# Patient Record
Sex: Female | Born: 1980 | Race: Black or African American | Hispanic: No | Marital: Single | Smoking: Never smoker
Health system: Southern US, Community
[De-identification: ages and names within clinical notes are randomized; demographics above are authoritative.]

## PROBLEM LIST (undated history)

## (undated) DIAGNOSIS — A630 Anogenital (venereal) warts: Secondary | ICD-10-CM

## (undated) DIAGNOSIS — F419 Anxiety disorder, unspecified: Secondary | ICD-10-CM

## (undated) DIAGNOSIS — R002 Palpitations: Secondary | ICD-10-CM

## (undated) DIAGNOSIS — B9689 Other specified bacterial agents as the cause of diseases classified elsewhere: Secondary | ICD-10-CM

## (undated) DIAGNOSIS — R0602 Shortness of breath: Secondary | ICD-10-CM

## (undated) DIAGNOSIS — M2550 Pain in unspecified joint: Secondary | ICD-10-CM

## (undated) DIAGNOSIS — M419 Scoliosis, unspecified: Secondary | ICD-10-CM

## (undated) DIAGNOSIS — F329 Major depressive disorder, single episode, unspecified: Secondary | ICD-10-CM

## (undated) DIAGNOSIS — M549 Dorsalgia, unspecified: Secondary | ICD-10-CM

## (undated) DIAGNOSIS — F32A Depression, unspecified: Secondary | ICD-10-CM

## (undated) DIAGNOSIS — N760 Acute vaginitis: Secondary | ICD-10-CM

## (undated) HISTORY — DX: Dorsalgia, unspecified: M54.9

## (undated) HISTORY — DX: Palpitations: R00.2

## (undated) HISTORY — DX: Pain in unspecified joint: M25.50

## (undated) HISTORY — DX: Acute vaginitis: N76.0

## (undated) HISTORY — DX: Shortness of breath: R06.02

## (undated) HISTORY — DX: Anxiety disorder, unspecified: F41.9

## (undated) HISTORY — DX: Other specified bacterial agents as the cause of diseases classified elsewhere: B96.89

## (undated) HISTORY — PX: SPINAL FUSION: SHX223

## (undated) HISTORY — DX: Anogenital (venereal) warts: A63.0

## (undated) HISTORY — DX: Scoliosis, unspecified: M41.9

---

## 2006-02-21 ENCOUNTER — Emergency Department: Payer: Self-pay

## 2006-03-05 ENCOUNTER — Emergency Department: Payer: Self-pay | Admitting: Emergency Medicine

## 2006-04-08 ENCOUNTER — Emergency Department: Payer: Self-pay | Admitting: Emergency Medicine

## 2006-04-08 ENCOUNTER — Emergency Department: Payer: Self-pay

## 2006-04-10 ENCOUNTER — Ambulatory Visit: Payer: Self-pay | Admitting: Emergency Medicine

## 2006-04-13 ENCOUNTER — Emergency Department: Payer: Self-pay | Admitting: Emergency Medicine

## 2006-04-22 ENCOUNTER — Emergency Department: Payer: Self-pay | Admitting: General Practice

## 2006-04-22 ENCOUNTER — Emergency Department: Payer: Self-pay | Admitting: Emergency Medicine

## 2006-06-04 ENCOUNTER — Observation Stay: Payer: Self-pay

## 2006-07-10 ENCOUNTER — Observation Stay: Payer: Self-pay

## 2006-07-11 DIAGNOSIS — A630 Anogenital (venereal) warts: Secondary | ICD-10-CM

## 2006-07-11 HISTORY — DX: Anogenital (venereal) warts: A63.0

## 2006-08-02 ENCOUNTER — Observation Stay: Payer: Self-pay | Admitting: Obstetrics & Gynecology

## 2006-08-24 ENCOUNTER — Observation Stay: Payer: Self-pay

## 2006-08-25 ENCOUNTER — Emergency Department: Payer: Self-pay | Admitting: Emergency Medicine

## 2006-10-05 ENCOUNTER — Observation Stay: Payer: Self-pay | Admitting: Unknown Physician Specialty

## 2006-10-15 ENCOUNTER — Inpatient Hospital Stay: Payer: Self-pay | Admitting: Obstetrics & Gynecology

## 2007-03-02 ENCOUNTER — Other Ambulatory Visit: Payer: Self-pay

## 2007-03-02 ENCOUNTER — Emergency Department: Payer: Self-pay | Admitting: Emergency Medicine

## 2007-06-09 ENCOUNTER — Emergency Department: Payer: Self-pay | Admitting: Emergency Medicine

## 2007-06-10 ENCOUNTER — Emergency Department: Payer: Self-pay | Admitting: Internal Medicine

## 2007-08-07 ENCOUNTER — Other Ambulatory Visit: Payer: Self-pay

## 2007-08-07 ENCOUNTER — Emergency Department: Payer: Self-pay | Admitting: Emergency Medicine

## 2007-08-16 ENCOUNTER — Emergency Department: Payer: Self-pay | Admitting: Emergency Medicine

## 2007-09-24 ENCOUNTER — Emergency Department: Payer: Self-pay | Admitting: Emergency Medicine

## 2007-12-01 ENCOUNTER — Emergency Department (HOSPITAL_COMMUNITY): Admission: EM | Admit: 2007-12-01 | Discharge: 2007-12-01 | Payer: Self-pay | Admitting: Emergency Medicine

## 2007-12-19 ENCOUNTER — Emergency Department (HOSPITAL_COMMUNITY): Admission: EM | Admit: 2007-12-19 | Discharge: 2007-12-19 | Payer: Self-pay | Admitting: Emergency Medicine

## 2007-12-21 ENCOUNTER — Inpatient Hospital Stay (HOSPITAL_COMMUNITY): Admission: AD | Admit: 2007-12-21 | Discharge: 2007-12-21 | Payer: Self-pay | Admitting: Obstetrics & Gynecology

## 2007-12-24 ENCOUNTER — Inpatient Hospital Stay (HOSPITAL_COMMUNITY): Admission: AD | Admit: 2007-12-24 | Discharge: 2007-12-24 | Payer: Self-pay | Admitting: Obstetrics & Gynecology

## 2008-02-12 ENCOUNTER — Other Ambulatory Visit: Payer: Self-pay

## 2008-02-12 ENCOUNTER — Emergency Department: Payer: Self-pay | Admitting: Emergency Medicine

## 2008-02-20 ENCOUNTER — Emergency Department: Payer: Self-pay | Admitting: Emergency Medicine

## 2008-06-09 ENCOUNTER — Emergency Department: Payer: Self-pay | Admitting: Internal Medicine

## 2008-12-24 ENCOUNTER — Emergency Department: Payer: Self-pay | Admitting: Emergency Medicine

## 2009-03-11 ENCOUNTER — Emergency Department: Payer: Self-pay | Admitting: Emergency Medicine

## 2009-09-08 ENCOUNTER — Emergency Department: Payer: Self-pay | Admitting: Emergency Medicine

## 2011-04-06 LAB — URINALYSIS, ROUTINE W REFLEX MICROSCOPIC
Nitrite: NEGATIVE
Specific Gravity, Urine: 1.026
pH: 6

## 2011-04-06 LAB — POCT I-STAT, CHEM 8
HCT: 38
Hemoglobin: 12.9
Potassium: 4
Sodium: 141

## 2011-04-07 LAB — CBC
Hemoglobin: 12.4
RBC: 4.24
RDW: 15

## 2011-04-07 LAB — DIFFERENTIAL
Basophils Absolute: 0
Lymphocytes Relative: 24
Monocytes Absolute: 0.2
Neutro Abs: 4.2

## 2011-04-07 LAB — URINE MICROSCOPIC-ADD ON

## 2011-04-07 LAB — URINALYSIS, ROUTINE W REFLEX MICROSCOPIC
Bilirubin Urine: NEGATIVE
Leukocytes, UA: NEGATIVE
Nitrite: NEGATIVE
Specific Gravity, Urine: 1.023
pH: 6.5

## 2011-04-07 LAB — WET PREP, GENITAL: Yeast Wet Prep HPF POC: NONE SEEN

## 2011-04-07 LAB — HCG, QUANTITATIVE, PREGNANCY
hCG, Beta Chain, Quant, S: 271 — ABNORMAL HIGH
hCG, Beta Chain, Quant, S: 66 — ABNORMAL HIGH

## 2011-04-07 LAB — ABO/RH: ABO/RH(D): B POS

## 2011-04-07 LAB — GC/CHLAMYDIA PROBE AMP, GENITAL
Chlamydia, DNA Probe: NEGATIVE
GC Probe Amp, Genital: NEGATIVE

## 2014-07-03 ENCOUNTER — Emergency Department: Payer: Self-pay | Admitting: Emergency Medicine

## 2014-07-03 LAB — URINALYSIS, COMPLETE
BILIRUBIN, UR: NEGATIVE
BLOOD: NEGATIVE
Bacteria: NONE SEEN
Glucose,UR: NEGATIVE mg/dL (ref 0–75)
Leukocyte Esterase: NEGATIVE
NITRITE: NEGATIVE
Ph: 5 (ref 4.5–8.0)
Protein: 30
Specific Gravity: 1.029 (ref 1.003–1.030)

## 2015-04-17 ENCOUNTER — Inpatient Hospital Stay
Admission: EM | Admit: 2015-04-17 | Discharge: 2015-04-22 | DRG: 885 | Disposition: A | Discharge status: Home / Self Care | Payer: Medicaid Other | Source: Intra-hospital | Attending: Psychiatry | Admitting: Psychiatry

## 2015-04-17 ENCOUNTER — Emergency Department
Admission: EM | Admit: 2015-04-17 | Discharge: 2015-04-17 | Disposition: A | Discharge status: Other Acute Inpatient Hospital | Payer: Medicaid Other | Attending: Emergency Medicine | Admitting: Emergency Medicine

## 2015-04-17 ENCOUNTER — Encounter: Payer: Self-pay | Admitting: Emergency Medicine

## 2015-04-17 DIAGNOSIS — F4010 Social phobia, unspecified: Secondary | ICD-10-CM | POA: Diagnosis present

## 2015-04-17 DIAGNOSIS — Z3A13 13 weeks gestation of pregnancy: Secondary | ICD-10-CM | POA: Insufficient documentation

## 2015-04-17 DIAGNOSIS — F332 Major depressive disorder, recurrent severe without psychotic features: Secondary | ICD-10-CM | POA: Diagnosis present

## 2015-04-17 DIAGNOSIS — F329 Major depressive disorder, single episode, unspecified: Secondary | ICD-10-CM | POA: Diagnosis not present

## 2015-04-17 DIAGNOSIS — F4310 Post-traumatic stress disorder, unspecified: Secondary | ICD-10-CM | POA: Diagnosis present

## 2015-04-17 DIAGNOSIS — Z5900 Homelessness unspecified: Secondary | ICD-10-CM

## 2015-04-17 DIAGNOSIS — Z3490 Encounter for supervision of normal pregnancy, unspecified, unspecified trimester: Secondary | ICD-10-CM

## 2015-04-17 DIAGNOSIS — R45851 Suicidal ideations: Secondary | ICD-10-CM | POA: Diagnosis present

## 2015-04-17 DIAGNOSIS — O4690 Antepartum hemorrhage, unspecified, unspecified trimester: Secondary | ICD-10-CM

## 2015-04-17 DIAGNOSIS — F32A Depression, unspecified: Secondary | ICD-10-CM

## 2015-04-17 DIAGNOSIS — Z62811 Personal history of psychological abuse in childhood: Secondary | ICD-10-CM | POA: Diagnosis present

## 2015-04-17 DIAGNOSIS — F339 Major depressive disorder, recurrent, unspecified: Secondary | ICD-10-CM | POA: Diagnosis present

## 2015-04-17 DIAGNOSIS — G4710 Hypersomnia, unspecified: Secondary | ICD-10-CM | POA: Diagnosis present

## 2015-04-17 DIAGNOSIS — O99341 Other mental disorders complicating pregnancy, first trimester: Secondary | ICD-10-CM | POA: Diagnosis present

## 2015-04-17 DIAGNOSIS — O209 Hemorrhage in early pregnancy, unspecified: Secondary | ICD-10-CM

## 2015-04-17 DIAGNOSIS — Z818 Family history of other mental and behavioral disorders: Secondary | ICD-10-CM

## 2015-04-17 HISTORY — DX: Palpitations: R00.2

## 2015-04-17 HISTORY — DX: Depression, unspecified: F32.A

## 2015-04-17 HISTORY — DX: Major depressive disorder, single episode, unspecified: F32.9

## 2015-04-17 LAB — URINE DRUG SCREEN, QUALITATIVE (ARMC ONLY)
Amphetamines, Ur Screen: NOT DETECTED
BARBITURATES, UR SCREEN: NOT DETECTED
Benzodiazepine, Ur Scrn: NOT DETECTED
CANNABINOID 50 NG, UR ~~LOC~~: NOT DETECTED
COCAINE METABOLITE, UR ~~LOC~~: NOT DETECTED
MDMA (ECSTASY) UR SCREEN: NOT DETECTED
Methadone Scn, Ur: NOT DETECTED
Opiate, Ur Screen: NOT DETECTED
PHENCYCLIDINE (PCP) UR S: NOT DETECTED
TRICYCLIC, UR SCREEN: NOT DETECTED

## 2015-04-17 MED ORDER — ACETAMINOPHEN 325 MG PO TABS: 650.0000 mg | ORAL_TABLET | Freq: Once | ORAL | Status: DC

## 2015-04-17 NOTE — ED Notes (Signed)
BEHAVIORAL HEALTH ROUNDING Patient sleeping: No. Patient alert and oriented: yes Behavior appropriate: Yes.  ;  Nutrition and fluids offered: Yes  Toileting and hygiene offered: Yes  Sitter present: no Law enforcement present: Yes   

## 2015-04-17 NOTE — ED Notes (Addendum)
BEHAVIORAL HEALTH ROUNDING Patient sleeping: No. Patient alert and oriented: yes Behavior appropriate: Yes.  ; If no, describe:  Nutrition and fluids offered: Yes  Toileting and hygiene offered: Yes  Sitter present: yes Law enforcement present: Yes  

## 2015-04-17 NOTE — ED Notes (Signed)
TTS at bedside. 

## 2015-04-17 NOTE — ED Provider Notes (Signed)
-----------------------------------------   4:34 PM on 04/17/2015 -----------------------------------------  Patient has been seen by psychiatry, they are voluntarily admitting the patient to their service for further treatment and evaluation of the patient's depression.  Minna Antis, MD 04/17/15 [phone]
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
## 2015-04-17 NOTE — ED Provider Notes (Signed)
[facility] Emergency Department Provider Note REMINDER - THIS NOTE IS NOT A FINAL MEDICAL RECORD UNTIL IT IS SIGNED. UNTIL THEN, THE CONTENT BELOW MAY REFLECT INFORMATION FROM A DOCUMENTATION TEMPLATE, NOT THE ACTUAL PATIENT VISIT. ____________________________________________  Time seen: Approximately 11:32 AM  I have reviewed the triage vital signs and the nursing notes.   HISTORY  Chief Complaint Depression    HPI Cathy Dunn is a 34 y.o. female reports a long-standing history of depression. She reports that due to stresses of taking care of children at home, and also being [redacted] weeks pregnant that she is becoming more depressed again. She is currently not on any treatment. She adamantly denies any thoughts of hurting herself or anyone else. She has no hallucinations. She comes voluntarily seeking care for worsening depression. She does report that time she feels just no sense of wanting to live, that she is just overwhelmed with life and stressors. Denies any previous attempts to injure herself. She reports she's had depression since approximately age 58.   No pain. No recent infections or fevers. She is planning to set up care with [facility] within the next 1-2 weeks regarding this pregnancy. She does not smoke.  Past Medical History  Diagnosis Date  . Depression   . Heart palpitations     There are no active problems to display for this patient.   History reviewed. No pertinent past surgical history.  No current outpatient prescriptions on file.  Allergies Review of patient's allergies indicates no known allergies.  History reviewed. No pertinent family history.  Social History Social History  Substance Use Topics  . Smoking status: Never Smoker   . Smokeless tobacco: None  . Alcohol Use: No    Review of Systems Constitutional: No fever/chills Eyes: No visual changes. ENT: No sore throat. Cardiovascular:  Denies chest pain. Respiratory: Denies shortness of breath. Gastrointestinal: No abdominal pain.  No nausea, no vomiting.  No diarrhea.  No constipation. Genitourinary: Negative for dysuria. Musculoskeletal: Negative for back pain. Skin: Negative for rash. Positive for pregnancy. Denies any abdominal pain, vaginal discharge or other symptoms. Neurological: Negative for headaches, focal weakness or numbness.  10-point ROS otherwise negative.  ____________________________________________   PHYSICAL EXAM:  VITAL SIGNS: ED Triage Vitals  Enc Vitals Group     BP 04/17/15 1041 132/75 mmHg     Pulse Rate 04/17/15 1041 80     Resp 04/17/15 1041 18     Temp 04/17/15 1041 98.3 F (36.8 C)     Temp Source 04/17/15 1041 Oral     SpO2 04/17/15 1041 100 %     Weight 04/17/15 1041 250 lb (113.399 kg)     Height 04/17/15 1041  (1.651 m)     Head Cir --      Peak Flow --      Pain Score 04/17/15 1044 0     Pain Loc --      Pain Edu? --      Excl. in GC? --    Constitutional: Alert and oriented. Well appearing and in no acute distress. Patient is very calm, amicable. Eyes: Conjunctivae are normal. PERRL. EOMI. Head: Atraumatic. Nose: No congestion/rhinnorhea. Mouth/Throat: Mucous membranes are moist.  Oropharynx non-erythematous. Neck: No stridor.   Cardiovascular: Normal rate, regular rhythm. Grossly normal heart sounds.  Good peripheral circulation. Respiratory: Normal respiratory effort.  No retractions. Lungs CTAB. Gastrointestinal: Soft and nontender. No distention. No abdominal bruits. No CVA tenderness. Musculoskeletal: No  lower extremity tenderness nor edema.  No joint effusions. Neurologic:  Normal speech and language. No gross focal neurologic deficits are appreciated. No gait instability. Skin:  Skin is warm, dry and intact. No rash noted. Psychiatric: Mood and affect are normal. Speech and behavior are normal. The patient is calm and  appropriate.  ____________________________________________   LABS (all labs ordered are listed, but only abnormal results are displayed)  Labs Reviewed  POC URINE PREG, ED   ____________________________________________  EKG   ____________________________________________  RADIOLOGY   ____________________________________________   PROCEDURES  Procedure(s) performed: None  Critical Care performed: No  ____________________________________________   INITIAL IMPRESSION / ASSESSMENT AND PLAN / ED COURSE  Pertinent labs & imaging results that were available during my care of the patient were reviewed by me and considered in my medical decision making (see chart for details).  Patient presents for evaluation of depression. She does not have any concerning symptoms such as suicidal thoughts, hallucinations. She expresses insight into her disease, and is here for evaluation for ongoing psychiatric care. Have ordered consultation by psychiatry, for which the patient is presently voluntary. She is pregnant, she is aware of this and reports she will following up with  health Department in the next 1-2 weeks.  ----------------------------------------- 3:56 PM on 04/17/2015 -----------------------------------------  Ongoing care assigned Dr. Lenard Lance. Follow-up on consult recommendations Dr. Toni Amend. ____________________________________________   FINAL CLINICAL IMPRESSION(S) / ED DIAGNOSES  Final diagnoses:  Depression      Sharyn Creamer, MD 04/17/15 1557

## 2015-04-17 NOTE — ED Notes (Signed)

## 2015-04-17 NOTE — ED Notes (Signed)
RN called TTS to request update on pts room status in behavioral unit. TTS reports they will call and request update and then will call this RN with update.
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
## 2015-04-17 NOTE — ED Notes (Addendum)

## 2015-04-17 NOTE — ED Notes (Signed)
BEHAVIORAL HEALTH ROUNDING Patient sleeping: No. Patient alert and oriented: yes Behavior appropriate: Yes.  ; If no, describe:  Nutrition and fluids offered: Yes  Toileting and hygiene offered: Yes  Sitter present: yes Law enforcement present: Yes  

## 2015-04-17 NOTE — ED Notes (Signed)
Pt to ed with c/o depression intermittently x 2 years,  Pt denies si, denies hi, denies hallucinations.

## 2015-04-18 ENCOUNTER — Inpatient Hospital Stay: Payer: Medicaid Other

## 2015-04-18 ENCOUNTER — Encounter: Payer: Self-pay | Admitting: Psychiatry

## 2015-04-18 DIAGNOSIS — F332 Major depressive disorder, recurrent severe without psychotic features: Principal | ICD-10-CM

## 2015-04-18 LAB — TYPE AND SCREEN
ABO/RH(D): B POS
ANTIBODY SCREEN: NEGATIVE

## 2015-04-18 LAB — HCG, QUANTITATIVE, PREGNANCY: HCG, BETA CHAIN, QUANT, S: 78801 m[IU]/mL — AB (ref <5)

## 2015-04-18 LAB — ABO/RH: ABO/RH(D): B POS

## 2015-04-18 MED ORDER — FOLIC ACID 1 MG PO TABS
1.0000 mg | ORAL_TABLET | Freq: Every day | ORAL | Status: DC
  Administered 2015-04-18 – 2015-04-22 (×5): 1 mg via ORAL
  Filled 2015-04-18 (×7): qty 1

## 2015-04-18 MED ORDER — ACETAMINOPHEN 325 MG PO TABS: 650.0000 mg | ORAL_TABLET | Freq: Four times a day (QID) | ORAL | Status: DC | PRN

## 2015-04-18 MED ORDER — DIPHENHYDRAMINE HCL 25 MG PO CAPS
50.0000 mg | ORAL_CAPSULE | Freq: Every evening | ORAL | Status: DC | PRN
  Administered 2015-04-18 – 2015-04-21 (×4): 50 mg via ORAL
  Filled 2015-04-18 (×4): qty 2

## 2015-04-18 MED ORDER — FLUOXETINE HCL 10 MG PO CAPS
10.0000 mg | ORAL_CAPSULE | Freq: Every day | ORAL | Status: DC
  Administered 2015-04-18 – 2015-04-19 (×2): 10 mg via ORAL
  Filled 2015-04-18 (×2): qty 1

## 2015-04-18 MED ORDER — PRENATAL MULTIVITAMIN CH
1.0000 | ORAL_TABLET | Freq: Every day | ORAL | Status: DC
  Administered 2015-04-19 – 2015-04-22 (×4): 1 via ORAL
  Filled 2015-04-18 (×7): qty 1

## 2015-04-18 MED ORDER — MAGNESIUM HYDROXIDE 400 MG/5ML PO SUSP
30.0000 mL | Freq: Every day | ORAL | Status: DC | PRN
Start: 2015-04-18 — End: 2015-04-22

## 2015-04-18 MED ORDER — ALUM & MAG HYDROXIDE-SIMETH 200-200-20 MG/5ML PO SUSP
30.0000 mL | ORAL | Status: DC | PRN
Start: 2015-04-18 — End: 2015-04-22

## 2015-04-18 NOTE — Progress Notes (Signed)
D: Patient has been isolative to her room most of the day. She was tearful this morning because she said her child was in the hospital here, and she couldn't see her. Patient was encouraged to speak with physician about this but she has not mentioned any more about it to nursing staff. Denies SI/HI/AVH. She c/o light bleeding this evening with mild abdominal cramping that she rated 3/10. A: Dr. Toni Amend notified of vaginal bleeding and Ob-Gyn consult called to Dr. Bonney Aid. Orders obtained. R: Continue to monitor.

## 2015-04-18 NOTE — BHH Suicide Risk Assessment (Signed)
[facility] Admission Suicide Risk Assessment   Nursing information obtained from:    review of current nursing notes. Discussion with nursing staff. Demographic factors:    patient has no clear place to live. Major financial stress. Family history of depression. Current Mental Status:    continues to be depressed. No suicidal plan. No active psychosis. Loss Factors:    loss of custody of child. Historical Factors:    history of recurrent depression Risk Reduction Factors:    very concerned about her unborn child and caring for her living child Total Time spent with patient: 35 minutes Principal Problem: Major depressive disorder (HCC) Diagnosis:   Patient Active Problem List   Diagnosis Date Noted  . Major depressive disorder (HCC) [F32.9] 04/18/2015  . Major depression (HCC) [F32.9] 04/17/2015  . Pregnant [Z33.1] 04/17/2015  . PTSD (post-traumatic stress disorder) [F43.10] 04/17/2015  . Homeless [Z59.0] 04/17/2015     Continued Clinical Symptoms:  Alcohol Use Disorder Identification Test Final Score (AUDIT): 1 The "Alcohol Use Disorders Identification Test", Guidelines for Use in Primary Care, Second Edition.  World Science writer [facility]). Score between 0-7:  no or low risk or alcohol related problems. Score between 8-15:  moderate risk of alcohol related problems. Score between 16-19:  high risk of alcohol related problems. Score 20 or above:  warrants further diagnostic evaluation for alcohol dependence and treatment.   CLINICAL FACTORS:   Depression:   Anhedonia Hopelessness   Musculoskeletal: Strength & Muscle Tone: within normal limits Gait & Station: normal Patient leans: N/A  Psychiatric Specialty Exam: Physical Exam  Nursing note and vitals reviewed. Constitutional: She appears well-developed and well-nourished.  HENT:  Head: Normocephalic and atraumatic.  Eyes: Conjunctivae are normal. Pupils are equal, round, and reactive to light.  Neck: Normal range of motion.   Cardiovascular: Normal heart sounds.   Respiratory: Effort normal.  GI: Soft. She exhibits distension.  Musculoskeletal: Normal range of motion.  Neurological: She is alert.  Skin: Skin is warm and dry.  Psychiatric: Judgment and thought content normal. Her affect is blunt. Her speech is delayed. She is slowed. Cognition and memory are normal. She exhibits a depressed mood.    Review of Systems  Constitutional: Negative.   HENT: Negative.   Eyes: Negative.   Respiratory: Negative.   Cardiovascular: Negative.   Gastrointestinal: Negative.   Musculoskeletal: Negative.   Skin: Negative.   Neurological: Negative.   Psychiatric/Behavioral: Positive for depression and suicidal ideas. Negative for hallucinations and substance abuse. The patient is nervous/anxious. The patient does not have insomnia.     Blood pressure 108/84, pulse 93, temperature 98.6 F (37 C), temperature source Oral, resp. rate 20, height  (1.651 m), weight 113.399 kg (250 lb), last menstrual period 01/18/2015, SpO2 99 %.Body mass index is 41.6 kg/(m^2).  General Appearance: Casual  Eye Contact::  Fair  Speech:  Clear and Coherent  Volume:  Decreased  Mood:  Dysphoric  Affect:  Congruent  Thought Process:  Goal Directed  Orientation:  Full (Time, Place, and Person)  Thought Content:  Negative  Suicidal Thoughts:  Yes.  without intent/plan  Homicidal Thoughts:  No  Memory:  Immediate;   Fair Recent;   Fair Remote;   Fair  Judgement:  Fair  Insight:  Fair  Psychomotor Activity:  Decreased  Concentration:  Fair  Recall:  [employer] of Knowledge:Fair  Language: Good  Akathisia:  No  Handed:  Right  AIMS (if indicated):     Assets:  Communication Skills  Desire for Improvement Physical Health Resilience  Sleep:  Number of Hours: 4.45  Cognition: WNL  ADL's:  Intact     COGNITIVE FEATURES THAT CONTRIBUTE TO RISK:  None    SUICIDE RISK:   Mild:  Suicidal ideation of limited frequency,  intensity, duration, and specificity.  There are no identifiable plans, no associated intent, mild dysphoria and related symptoms, good self-control (both objective and subjective assessment), few other risk factors, and identifiable protective factors, including available and accessible social support.  PLAN OF CARE: Patient continues to have some suicidal thoughts but without intent or plan and has a very positive feeling about wanting to take care of her child. Patient is being started on antidepressives. She is on 15 minute checks. Participating in group therapy. Outpatient follow-up being arranged.  Medical Decision Making:  New problem, with additional work up planned, Review of Psycho-Social Stressors (1), Review or order clinical lab tests (1), Review or order medicine tests (1) and Review of Medication Regimen & Side Effects (2)  I certify that inpatient services furnished can reasonably be expected to improve the patient's condition.   Jeilani Grupe 04/18/2015, 3:08 PM
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
## 2015-04-18 NOTE — Consult Note (Signed)
Obstetrics & Gynecology Consult H&P    Consulting Department: Behavioral Health  Consulting Physician: Mordecai Rasmussen MD  Consulting Question: Pregnant and spotting    History of Present Illness: Patient is a 34 y.o. [id number] at [redacted]w[redacted]d by sure LMP of 01/18/2015 giving EDC of 10/25/2015 currently admitted voluntarily to behavioral health for depression. This is an unplanned pregnancy, but desired pregnancy.  The patient has experienced nausea, no emesis, fatigue, and a recent episode of spotting when whipping after going to the bathroom.   Some mild cramping.  No passage of tissue.  Has not had any care this pregnancy, no prior ultrasound.  Her past pregnancies have been uncomplicated.  3 vaginal deliveries, largest 8+ pounds.  TB denies exposure Varicella history of Close contact with children (53, 41, and 80 years old) Remote history of chlamydia No cats No history of genetic diseases other than one daughter is sickle cell trait positive No history of birth defects    Review of Systems:10 point review of systems  Past Medical History:  Past Medical History  Diagnosis Date  . Depression   . Heart palpitations     Past Surgical History:  History reviewed. No pertinent past surgical history.   Family History:  History reviewed. No pertinent family history.  Social History:  Social History   Social History  . Marital Status: Single    Spouse Name: N/A  . Number of Children: N/A  . Years of Education: N/A   Occupational History  . Not on file.   Social History Main Topics  . Smoking status: Never Smoker   . Smokeless tobacco: Not on file  . Alcohol Use: No  . Drug Use: No  . Sexual Activity: Not on file   Other Topics Concern  . Not on file   Social History Narrative    Allergies:  No Known Allergies  Medications: Prior to Admission medications   Not on File    Physical Exam Vitals: Blood pressure 108/84, pulse 93, temperature 98.6 F (37 C), temperature  source Oral, resp. rate 20, height  (1.651 m), weight 113.399 kg (250 lb), last menstrual period 01/18/2015, SpO2 99 %.  General: NAD HEENT: normocephalic, anicteric Pulmonary: no increased work of breathing Cardiovascular: RRR Abdomen: NABS, soft, non-tender Genitourinary: deferred patient going down to ultrasound Extremities: no edema Neurologic: CN II-XII intact Psychiatric: A&O x 3, mood appropriate affect full  Labs: Results for orders placed or performed during the hospital encounter of 04/17/15 (from the past 72 hour(s))  Type and screen     Status: None   Collection Time: 04/18/15  6:07 PM  Result Value Ref Range   ABO/RH(D) B POS    Antibody Screen NEG    Sample Expiration 04/21/2015   hCG, quantitative, pregnancy     Status: Abnormal   Collection Time: 04/18/15  6:07 PM  Result Value Ref Range   hCG, Beta Chain, Quant, S 78801 (H) <5 mIU/mL    Comment:          GEST. AGE      CONC.  (mIU/mL)   <=1 WEEK        5 - 50     2 WEEKS       50 - 500     3 WEEKS       100 - 10,000     4 WEEKS     1,000 - 30,000     5 WEEKS     3,500 - 115,000  6-8 WEEKS     12,000 - 270,000    12 WEEKS     15,000 - 220,000        FEMALE AND NON-PREGNANT FEMALE:     LESS THAN 5 mIU/mL   ABO/Rh     Status: None   Collection Time: 04/18/15  6:08 PM  Result Value Ref Range   ABO/RH(D) B POS     Imaging No results found.  Assessment: 34 y.o. Z6X0960 currently admitted for depression, approximately 13 weeks pregnancy with spottting  Plan: 1) 1st trimester bleeding - discussed with patient that up to 1/3 of normal pregnancies will have some spotting.  As previable no specific interventions or precautions warranted. - TVUS to evaluate pregnancy and rule out ectopic - B pos rhogam not indicated  2) Depression - currently on Prozac (fluoxetine).  SSRI use in pregnancy as a drug class is associated with potential for neonatal abstinence syndrome in some infants.  No long term  neurodevelopmental deficits have been documented.  The preferred SSRI in pregnancy is Zoloft (sertraline).  Human studies have inconsistently shown a possible association between fluoxetine exposure and fetal cardiac defects.  If primary team feels therapeutic benefits outweigh risks would be ok to continue, I did discuss that in this setting would recommend fetal echocardiogram at 20-[redacted] weeks gestation  3) Start PNV  4) Will continue to follow during course of admission

## 2015-04-18 NOTE — Plan of Care (Signed)
Problem: Ineffective individual coping Goal: LTG: Patient will report a decrease in negative feelings Outcome: Not Progressing Depressed with flat affect. Denies SI.

## 2015-04-18 NOTE — Progress Notes (Signed)
Pt is a 34 year old female admitted for ongoing depression and suicide ideations without any specific plans. Pt is AOx3, vital sign stable, pt denies any pain. Pt is in a depressed mood and tearful during admission. 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 [place] she is currently living in a hotel. Pt is 3 months pregnant and has 3 other children under the age of 8 off all whom she has no custody of. Pt is currently denying any SI/HI/VAH, contracting to safety. Pt body audit completed, no contraband found. 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 Pt is oriented to the unit and room, handbook given, pt provided with food and drink, no concerns voiced, safety maintained.

## 2015-04-18 NOTE — Plan of Care (Signed)
Problem: Consults Goal: [facility] General Treatment Patient Education Outcome: Progressing Education completed, pt progressing towards accomplishing the goal.

## 2015-04-18 NOTE — Tx Team (Signed)
Initial Interdisciplinary Treatment Plan   PATIENT STRESSORS: Financial difficulties Loss of job Pregnant and homeless   PATIENT STRENGTHS: Ability for insight Average or above average intelligence Communication skills   PROBLEM LIST: Problem List/Patient Goals Date to be addressed Date deferred Reason deferred Estimated date of resolution  Depression      Financial difficulties      Pregnant                                           DISCHARGE CRITERIA:  Ability to meet basic life and health needs Improved stabilization in mood, thinking, and/or behavior  PRELIMINARY DISCHARGE PLAN: Outpatient therapy Placement in alternative living arrangements  PATIENT/FAMIILY INVOLVEMENT: This treatment plan has been presented to and reviewed with the patient, Cathy Dunn.  The patient have been given the opportunity to ask questions and make suggestions.  Joanathan Affeldt A Janesia Joswick 04/18/2015, 5:10 AM

## 2015-04-19 DIAGNOSIS — F332 Major depressive disorder, recurrent severe without psychotic features: Secondary | ICD-10-CM

## 2015-04-19 MED ORDER — SERTRALINE HCL 25 MG PO TABS
25.0000 mg | ORAL_TABLET | Freq: Every day | ORAL | Status: DC
  Filled 2015-04-19 (×2): qty 1
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
## 2015-04-19 NOTE — Progress Notes (Signed)
Obstetric and Gynecology  Subjective    Objective   Filed Vitals:   04/19/15 0659  BP: 125/85  Pulse: 91  Temp:   Resp:      Intake/Output Summary (Last 24 hours) at 04/19/15 1258 Last data filed at 04/19/15 1252  Gross per 24 hour  Intake    840 ml  Output      0 ml  Net    840 ml    General: Cardiovascular: Abdomen: Extremities:  Labs: Results for orders placed or performed during the hospital encounter of 04/17/15 (from the past 24 hour(s))  Type and screen     Status: None   Collection Time: 04/18/15  6:07 PM  Result Value Ref Range   ABO/RH(D) B POS    Antibody Screen NEG    Sample Expiration 04/21/2015   hCG, quantitative, pregnancy     Status: Abnormal   Collection Time: 04/18/15  6:07 PM  Result Value Ref Range   hCG, Beta Chain, Quant, S 46962 (H) <5 mIU/mL  ABO/Rh     Status: None   Collection Time: 04/18/15  6:08 PM  Result Value Ref Range   ABO/RH(D) B POS     Cultures: Results for orders placed or performed during the hospital encounter of 12/19/07  Wet prep, genital     Status: Abnormal   Collection Time: 12/19/07  7:11 AM  Result Value Ref Range Status   Yeast Wet Prep HPF POC NONE SEEN  Final   Trich, Wet Prep NONE SEEN  Final   Clue Cells Wet Prep HPF POC NONE SEEN  Final   WBC, Wet Prep HPF POC (A)  Final    FEW SPECIMEN OVERDILUTED.  RESULTS MAY BE AFFECTED.    Imaging: US Ob Comp Less 14 Wks  04/18/2015   CLINICAL DATA:  VAGINAL BLEEDING FOR 4 HR  EXAM: OBSTETRIC <14 WK ULTRASOUND  TECHNIQUE: Transabdominal ultrasound was performed for evaluation of the gestation as well as the maternal uterus and adnexal regions.  COMPARISON:  None.  FINDINGS: Intrauterine gestational sac: SINGLE  Yolk sac:  NO  Embryo:  YES  Cardiac Activity: YES  Heart Rate: 147 bpm  MSD:   mm    w     d  CRL:   65  mm   12 w 6 d                  [place] EDC: 10/25/2015  Maternal uterus/adnexae: UNREMARKABLE  IMPRESSION: Single living intrauterine gestation measuring 12  weeks 6 days by crown-rump length.   Electronically Signed   By: Ellery Plunk M.D.   On: 04/18/2015 22:52    Assessment   34 y.o. G5P0 at [redacted]w[redacted]d by D=12 week ultrasound currently admitted for depression, recent episode of 1st trimester spotting  Plan  - 1st trimester spotting reassurance provided - viable IUP - PNV written for - Once again will defer to primary team as to continuation of current SSRI given possible association with cardiac defect vs switching to zoloft. -  Will sign off, arrange follow up within the next week at [facility] to start prenatal care  '

## 2015-04-19 NOTE — Progress Notes (Signed)
[facility] MD Progress Note  04/19/2015 4:20 PM Cathy Dunn  MRN:  [id number] Subjective:  Follow-up for this 34 year old woman with major depression who is also pregnant. Asian interviewed. Labs reviewed. Vital signs reviewed. Consult note from OB/GYN reviewed. Nursing note reviewed. Social work note reviewed. Patient states that her mood is feeling better today. She has had a conversation with her family and they appear to be willing to help her find a more stable place to stay. She denies any suicidal ideation.  Last night she complained of some cramping and possible spotting. OB/GYN consult appreciated. Patient appears to be stable with no pregnancy complications. Principal Problem: Major depressive disorder (HCC) Diagnosis:   Patient Active Problem List   Diagnosis Date Noted  . Major depressive disorder (HCC) [F32.9] 04/18/2015  . Major depression (HCC) [F32.9] 04/17/2015  . Pregnant [Z33.1] 04/17/2015  . PTSD (post-traumatic stress disorder) [F43.10] 04/17/2015  . Homeless [Z59.0] 04/17/2015   Total Time spent with patient: 25 minutes  Past Psychiatric History: Patient has a long-standing history of depression by her account but no previous hospitalizations. Was receiving outpatient treatment in [place] until a few months ago. No previous suicide attempts.  Past Medical History:  Past Medical History  Diagnosis Date  . Depression   . Heart palpitations    History reviewed. No pertinent past surgical history. Family History: History reviewed. No pertinent family history. Family Psychiatric  History: Patient reports there being a family history of depression and anxiety in both of her parents. Social History:  History  Alcohol Use No     History  Drug Use No    Social History   Social History  . Marital Status: Single    Spouse Name: N/A  . Number of Children: N/A  . Years of Education: N/A   Social History Main Topics  . Smoking status: Never Smoker   . Smokeless  tobacco: None  . Alcohol Use: No  . Drug Use: No  . Sexual Activity: Not Asked   Other Topics Concern  . None   Social History Narrative   Additional Social History:                         Sleep: Fair  Appetite:  Good  Current Medications: Current Facility-Administered Medications  Medication Dose Route Frequency Provider Last Rate Last Dose  . acetaminophen (TYLENOL) tablet 650 mg  650 mg Oral Q6H PRN Audery Amel, MD      . alum & mag hydroxide-simeth (MAALOX/MYLANTA) 200-200-20 MG/5ML suspension 30 mL  30 mL Oral Q4H PRN Audery Amel, MD      . diphenhydrAMINE (BENADRYL) capsule 50 mg  50 mg Oral QHS PRN Audery Amel, MD   50 mg at 04/18/15 2251  . folic acid (FOLVITE) tablet 1 mg  1 mg Oral Daily Audery Amel, MD   1 mg at 04/19/15 1043  . magnesium hydroxide (MILK OF MAGNESIA) suspension 30 mL  30 mL Oral Daily PRN Audery Amel, MD      . prenatal multivitamin tablet 1 tablet  1 tablet Oral Q1200 Vena Austria, MD   1 tablet at 04/19/15 0100  . sertraline (ZOLOFT) tablet 25 mg  25 mg Oral Daily Audery Amel, MD        Lab Results:  Results for orders placed or performed during the hospital encounter of 04/17/15 (from the past 48 hour(s))  Type and screen     Status:  None   Collection Time: 04/18/15  6:07 PM  Result Value Ref Range   ABO/RH(D) B POS    Antibody Screen NEG    Sample Expiration 04/21/2015   hCG, quantitative, pregnancy     Status: Abnormal   Collection Time: 04/18/15  6:07 PM  Result Value Ref Range   hCG, Beta Chain, Quant, S 78801 (H) <5 mIU/mL    Comment:          GEST. AGE      CONC.  (mIU/mL)   <=1 WEEK        5 - 50     2 WEEKS       50 - 500     3 WEEKS       100 - 10,000     4 WEEKS     1,000 - 30,000     5 WEEKS     3,500 - 115,000   6-8 WEEKS     12,000 - 270,000    12 WEEKS     15,000 - 220,000        FEMALE AND NON-PREGNANT FEMALE:     LESS THAN 5 mIU/mL   ABO/Rh     Status: None   Collection Time: 04/18/15   6:08 PM  Result Value Ref Range   ABO/RH(D) B POS     Physical Findings: AIMS:  , ,  ,  ,    CIWA:    COWS:     Musculoskeletal: Strength & Muscle Tone: within normal limits Gait & Station: normal Patient leans: N/A  Psychiatric Specialty Exam: Review of Systems  Constitutional: Negative.   HENT: Negative.   Eyes: Negative.   Respiratory: Negative.   Cardiovascular: Negative.   Gastrointestinal: Negative.   Musculoskeletal: Negative.   Skin: Negative.   Neurological: Negative.   Psychiatric/Behavioral: Negative for depression, suicidal ideas, hallucinations, memory loss and substance abuse. The patient is not nervous/anxious and does not have insomnia.     Blood pressure 125/85, pulse 91, temperature 99.1 F (37.3 C), temperature source Oral, resp. rate 20, height  (1.651 m), weight 113.399 kg (250 lb), last menstrual period 01/18/2015, SpO2 99 %.Body mass index is 41.6 kg/(m^2).  General Appearance: Fairly Groomed  Patent attorney::  Fair  Speech:  Clear and Coherent  Volume:  Normal  Mood:  Euthymic  Affect:  Congruent  Thought Process:  Goal Directed  Orientation:  Full (Time, Place, and Person)  Thought Content:  Negative  Suicidal Thoughts:  No  Homicidal Thoughts:  No  Memory:  Immediate;   Fair Recent;   Fair Remote;   Fair  Judgement:  Intact  Insight:  Fair  Psychomotor Activity:  Normal  Concentration:  Fair  Recall:  [employer] of Knowledge:Fair  Language: Fair  Akathisia:  No  Handed:  Right  AIMS (if indicated):     Assets:  Communication Skills Desire for Improvement Resilience Social Support  ADL's:  Intact  Cognition: WNL  Sleep:  Number of Hours: 6.5   Treatment Plan Summary: Daily contact with patient to assess and evaluate symptoms and progress in treatment, Medication management and Plan Patient continues to participate well on the unit. She is going to daily group psychotherapy. Participated well and was open in individual therapy.  No complaints of any medicine side effects. I reviewed the note from the OB/GYN. I am aware that over the last couple decades there've been multiple studies regarding the relative safety of antidepressives in pregnancy which  at various times have pointed fingers at various different medicines. The one thing that is consistent is that peroxide 15 is considered higher risk. I am however now aware that the currently most influential study is the 81 [facility] medical journal studies suggesting that fluoxetine may also share some of the increased risk of cardiac defects, although previous studies had actually shown the reverse with other medicines being of higher risk. Nevertheless I will defer to the current perception and understanding and discontinue the fluoxetine and start the patient on sertraline 25 mg per day. Patient is requesting discharge within the next day or so and I encouraged her to talk with her primary psychiatrist.No other change to medical plan. Continue vitamins.  John Clapacs 04/19/2015, 4:20 PM

## 2015-04-19 NOTE — Progress Notes (Signed)
D: Pt is awake and active in the milieu this evening. Pt mood is depressed and his affect is sad/flat. Pt denies SI/HI and AVH at this time. MD ordered ultrasound imaging and met with pt on the unit.   A: Writer provided emotional support and administered medications as prescribed. Writer confirmed availability on imaging schedule and transported pt upstairs.   R: Pt seemed pleased that the imaging results were positive, She had no complaints and went to bed shortly after her procedure.

## 2015-04-19 NOTE — BHH Group Notes (Signed)
[facility] Group Notes:  (Nursing/MHT/Case Management/Adjunct)  Date:  04/19/2015  Time:  2:23 AM  Type of Therapy:  Group Therapy  Participation Level:  Active  Participation Quality:  Appropriate  Affect:  Appropriate  Cognitive:  Appropriate  Insight:  Appropriate  Engagement in Group:  Engaged  Modes of Intervention:  n/a  Summary of Progress/Problems:  Cathy Dunn 04/19/2015, 2:23 AM

## 2015-04-19 NOTE — Progress Notes (Signed)
D: Patient has appeared depressed with a flat affect. Denies SI. Emotionally guarded. Does not discuss her pregnancy. A: On q 15 minute checks; Given meds. R: Sad and depressed.

## 2015-04-19 NOTE — Progress Notes (Signed)
Patient declined to take Zoloft. She requests to stay on Prozac.

## 2015-04-19 NOTE — Progress Notes (Signed)
   04/19/15 1630  Clinical Encounter Type  Visited With Patient  Visit Type Initial  Referral From Nurse  Consult/Referral To Chaplain  Spiritual Encounters  Spiritual Needs Emotional  Stress Factors  Patient Stress Factors Major life changes  Rec'd consultation request. Visited with pt. Pt told about her reason for being here. Expressed that things are going well and expects to be released tomorrow. Pt thanked for the visit offering of prayer. Konrad Penta [phone]

## 2015-04-19 NOTE — B[facility] Group Notes (Signed)
B[facility] LCSW Group Therapy  04/19/2015 4:11 PM  Type of Therapy:  Group Therapy  Participation Level:  Minimal  Participation Quality:  Attentive  Affect:  Depressed  Cognitive:  Alert  Insight:  Limited  Engagement in Therapy:  Limited  Modes of Intervention:  Discussion, Education, Socialization and Support  Summary of Progress/Problems: Todays topic: Grudges  Patients will be encouraged to discuss their thoughts, feelings, and behaviors as to why one holds on to grudges and reasons why people have grudges. Patients will process the impact of grudges on their daily lives and identify thoughts and feelings related to holding grudges. Patients will identify feelings and thoughts related to what life would look like without grudges. Pt attended group and stayed the entire time. She sat quietly and listened to other group members.   Cathy Dunn L Brady Plant MSW, LCSWA  04/19/2015, 4:11 PM

## 2015-04-19 NOTE — Plan of Care (Signed)
Problem: Alteration in mood Goal: LTG-Patient reports reduction in suicidal thoughts (Patient reports reduction in suicidal thoughts and is able to verbalize a safety plan for whenever patient is feeling suicidal)  Outcome: Progressing Pt denies SI     

## 2015-04-20 ENCOUNTER — Encounter: Payer: Self-pay | Admitting: Psychiatry

## 2015-04-20 MED ORDER — FLUOXETINE HCL 20 MG PO CAPS
20.0000 mg | ORAL_CAPSULE | Freq: Every day | ORAL | Status: DC
  Administered 2015-04-20 – 2015-04-22 (×3): 20 mg via ORAL
  Filled 2015-04-20 (×3): qty 1

## 2015-04-20 NOTE — Progress Notes (Signed)
Denies depression & suicidal ideation.Verbelized that she is little anxious & tired.Appropriate with staff & peers.Compliant with medications.Did not attend groups as she was feeling tired.

## 2015-04-20 NOTE — B[facility] Group Notes (Signed)
[facility] Group Notes:  (Nursing/MHT/Case Management/Adjunct)  Date:  04/20/2015  Time:  2:00 PM  Type of Therapy:  Psychoeducational Skills  Participation Level:  Active  Participation Quality:  Sharing and Supportive  Affect:  Appropriate  Cognitive:  Appropriate  Insight:  Improving  Engagement in Group:  Supportive  Modes of Intervention:  Clarification and Discussion  Summary of Progress/Problems:  Cathy Dunn 04/20/2015, 2:00 PM

## 2015-04-20 NOTE — Plan of Care (Signed)
Problem: Alteration in mood Goal: LTG-Pt's behavior demonstrates decreased signs of depression (Patient's behavior demonstrates decreased signs of depression to the point the patient is safe to return home and continue treatment in an outpatient setting)  Outcome: Progressing Pt playing cards and interacting more with patients and staff.

## 2015-04-20 NOTE — Progress Notes (Signed)
D: Pt is awake and active in the milieu this evening. Pt mood is sad and his affect is flat. Pt denies SI/HI and AVH at this time. Pt has been interacting more with her peers and staff. She was playing cards in the dayroom tonight  A: Writer provided emotional support and administered medications as prescribed.  R: Pt behavior is appropriate on the unit, and she is pleasant and cooperative with staff.

## 2015-04-20 NOTE — Progress Notes (Signed)
Recreation Therapy Notes  Date: 10.10.16 Time: 3:00 pm Location: Craft Room  Group Topic: Self-expression  Goal Area(s) Addresses:  Patient will identify one color per emotion listed on wheel.  Patient will verbalize benefit of using art as a means of self-expression. Patient will verbalize one emotion experienced during session. Patient will be educated on other forms of self-expression.  Behavioral Response: Did not attend  Intervention: Emotion Wheel  Activity: Patients were given a worksheet with 7 different emotions. Patients were instructed to pick a color for each emotion.   Education: LRT educated patients on different forms of self-expression.  Education Outcome: Patient did not attend group.  Clinical Observations/Feedback: Patient did not attend group.  Jacquelynn Cree, LRT/CTRS 04/20/2015 4:16 PM

## 2015-04-20 NOTE — Progress Notes (Signed)
Recreation Therapy Notes  At approximately 2:30 pm, LRT attempted assessment. Patient sleeping.  Jacquelynn Cree, LRT/CTRS 04/20/2015 2:37 PM

## 2015-04-20 NOTE — Progress Notes (Signed)
[facility] MD Progress Note  04/20/2015 12:29 PM Cathy Dunn  MRN:  [id number]  Subjective:  Cathy Dunn feels much better today after she rested in the hospital over the weekend. She is reassured that her pregnancy is developing well as she now wants to keep the baby. She refused to take Zoloft as it did not work for the past that is ready to take Prozac for depression. She wants to be discharged to [facility] where she hopes to find support and help in accomplishing her goals of getting a GED, getting a job, and taking care of her children. There are no somatic complaints. Sleep and appetite are good. There is good group participation.  Principal Problem: <principal problem not specified> Diagnosis:   Patient Active Problem List   Diagnosis Date Noted  . Severe episode of recurrent major depressive disorder, without psychotic features (HCC) [F33.2]   . Pregnancy [Z33.1] 04/17/2015  . PTSD (post-traumatic stress disorder) [F43.10] 04/17/2015  . Homeless [Z59.0] 04/17/2015   Total Time spent with patient: 20 minutes  Past Psychiatric History: history of postpartum depression.  Past Medical History:  Past Medical History  Diagnosis Date  . Depression   . Heart palpitations    History reviewed. No pertinent past surgical history. Family History: History reviewed. No pertinent family history. Family Psychiatric  History: none reported. Social History:  History  Alcohol Use No     History  Drug Use No    Social History   Social History  . Marital Status: Single    Spouse Name: N/A  . Number of Children: N/A  . Years of Education: N/A   Social History Main Topics  . Smoking status: Never Smoker   . Smokeless tobacco: None  . Alcohol Use: No  . Drug Use: No  . Sexual Activity: Not Asked   Other Topics Concern  . None   Social History Narrative   Additional Social History:                         Sleep: Good  Appetite:  Good  Current  Medications: Current Facility-Administered Medications  Medication Dose Route Frequency Provider Last Rate Last Dose  . acetaminophen (TYLENOL) tablet 650 mg  650 mg Oral Q6H PRN Audery Amel, MD      . alum & mag hydroxide-simeth (MAALOX/MYLANTA) 200-200-20 MG/5ML suspension 30 mL  30 mL Oral Q4H PRN Audery Amel, MD      . diphenhydrAMINE (BENADRYL) capsule 50 mg  50 mg Oral QHS PRN Audery Amel, MD   50 mg at 04/19/15 2216  . FLUoxetine (PROZAC) capsule 20 mg  20 mg Oral Daily Jolanta B Pucilowska, MD      . folic acid (FOLVITE) tablet 1 mg  1 mg Oral Daily Audery Amel, MD   1 mg at 04/20/15 1043  . magnesium hydroxide (MILK OF MAGNESIA) suspension 30 mL  30 mL Oral Daily PRN Audery Amel, MD      . prenatal multivitamin tablet 1 tablet  1 tablet Oral Q1200 Vena Austria, MD   1 tablet at 04/19/15 0100    Lab Results:  Results for orders placed or performed during the hospital encounter of 04/17/15 (from the past 48 hour(s))  Type and screen     Status: None   Collection Time: 04/18/15  6:07 PM  Result Value Ref Range   ABO/RH(D) B POS    Antibody Screen NEG  Sample Expiration 04/21/2015   hCG, quantitative, pregnancy     Status: Abnormal   Collection Time: 04/18/15  6:07 PM  Result Value Ref Range   hCG, Beta Chain, Quant, S 78801 (H) <5 mIU/mL    Comment:          GEST. AGE      CONC.  (mIU/mL)   <=1 WEEK        5 - 50     2 WEEKS       50 - 500     3 WEEKS       100 - 10,000     4 WEEKS     1,000 - 30,000     5 WEEKS     3,500 - 115,000   6-8 WEEKS     12,000 - 270,000    12 WEEKS     15,000 - 220,000        FEMALE AND NON-PREGNANT FEMALE:     LESS THAN 5 mIU/mL   ABO/Rh     Status: None   Collection Time: 04/18/15  6:08 PM  Result Value Ref Range   ABO/RH(D) B POS     Physical Findings: AIMS:  , ,  ,  ,    CIWA:    COWS:     Musculoskeletal: Strength & Muscle Tone: within normal limits Gait & Station: normal Patient leans: N/A  Psychiatric  Specialty Exam: Review of Systems  All other systems reviewed and are negative.   Blood pressure 119/81, pulse 89, temperature 98 F (36.7 C), temperature source Oral, resp. rate 18, height  (1.651 m), weight 113.399 kg (250 lb), last menstrual period 01/18/2015, SpO2 99 %.Body mass index is 41.6 kg/(m^2).  General Appearance: Casual  Eye Contact::  Good  Speech:  Clear and Coherent  Volume:  Normal  Mood:  Euthymic  Affect:  Appropriate  Thought Process:  Goal Directed  Orientation:  Full (Time, Place, and Person)  Thought Content:  WDL  Suicidal Thoughts:  No  Homicidal Thoughts:  No  Memory:  Immediate;   Fair Recent;   Fair Remote;   Fair  Judgement:  Fair  Insight:  Fair  Psychomotor Activity:  Normal  Concentration:  Fair  Recall:  [employer] of Knowledge:Fair  Language: Fair  Akathisia:  No  Handed:  Right  AIMS (if indicated):     Assets:  Communication Skills Desire for Improvement Physical Health Resilience Social Support  ADL's:  Intact  Cognition: WNL  Sleep:  Number of Hours: 7.5   Treatment Plan Summary: Daily contact with patient to assess and evaluate symptoms and progress in treatment and Medication management   Cathy Dunn is a 34 year old female with a history of postpartum depression admitted for suicidal ideation in the context of new pregnancy and multiple social stressors.  1. Suicidal ideation. The patient is able to contract for safety in the hospital.  2. Mood. She was started on Prozac for depression.  3. Prepregnancy. Ultrasound indicates no problems. She is on prenatal vitamins and folic acid.  4. Disposition. She will be discharged to [facility] if possible. She will follow-up with a local provider.  Jolanta Pucilowska 04/20/2015, 12:29 PM

## 2015-04-20 NOTE — [facility] Group Notes (Signed)
[facility] LCSW Aftercare Discharge Planning Group Note   04/20/2015 1:20 PM  Participation Quality:  Patient did not attend group   Chany Woolworth T, MSW, LCSWA   

## 2015-04-21 MED ORDER — FOLIC ACID 1 MG PO TABS: 1.0000 mg | ORAL_TABLET | Freq: Every day | ORAL | Status: DC

## 2015-04-21 MED ORDER — PRENATAL MULTIVITAMIN CH: 1.0000 | ORAL_TABLET | Freq: Every day | ORAL | Status: DC

## 2015-04-21 MED ORDER — FLUOXETINE HCL 20 MG PO CAPS: 20.0000 mg | ORAL_CAPSULE | Freq: Every day | ORAL | Status: DC

## 2015-04-21 NOTE — Progress Notes (Signed)
D: Pt is awake and active in the milieu this evening. Pt mood is appropriate and her affect is flat. Pt denies SI/HI and AVH at this time, and she is interacting with staff and peers. Pt states that she has been experiencing/seeing "spots or stars" intermittently without exertion for the past couple of weeks, as well as some lightheadedness.   A: Writer provided emotional support and administered medications as prescribed.  R: Pt behavior is appropriate on the unit and she is pleasant and cooperative with staff.

## 2015-04-21 NOTE — [facility] Group Notes (Signed)
[facility] Group Notes:  (Nursing/MHT/Case Management/Adjunct)  Date:  04/21/2015  Time:  1:46 PM  Type of Therapy:  Psychoeducational Skills  Participation Level:  Minimal  Participation Quality:  Resistant  Affect:  Flat  Cognitive:  Lacking  Insight:  Limited  Engagement in Group:  Poor  Modes of Intervention:  Discussion, Education and Support  Summary of Progress/Problems:  Cathy Dunn 04/21/2015, 1:46 PM

## 2015-04-21 NOTE — Progress Notes (Signed)
Recreation Therapy Notes  Date: 10.11.16 Time: 3:00 pm Location: Craft Room  Group Topic: Goal Setting  Goal Area(s) Addresses:  Patient will write at least one goal. Patient will write at least one obstacle.  Behavioral Response: Attentive  Intervention: Recovery Goal Chart  Activity: Patients were instructed to make a goal chart including goals, obstacles, the date they started working on their goal, and the date they achieved their goal.  Education: LRT educated patients on healthy way to celebrate reaching their goals.  Education Outcome: In group clarification offered  Clinical Observations/Feedback: Patient completed activity by listing 3 goals and one obstacle. Patient did not contribute to group discussion.  Jacquelynn Cree, LRT/CTRS 04/21/2015 4:24 PM

## 2015-04-21 NOTE — [facility] Group Notes (Signed)
[facility] LCSW Group Therapy  04/21/2015 2:54 PM  Type of Therapy:  Group Therapy  Participation Level:  Did Not Attend    Lulu Riding, MSW, LCSWA 04/21/2015, 2:54 PM

## 2015-04-21 NOTE — Progress Notes (Signed)
Patient alert oriented x3, denies any SI/HI/AH during the shift. Patient displayed a flat affect during the shift. Patient was medication compliant during the shift and attended groups.
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
## 2015-04-21 NOTE — Progress Notes (Signed)
[facility] MD Progress Note  04/21/2015 3:53 PM LOUNA ROTHGEB  MRN:  [id number]  Subjective:  Cathy Dunn is slightly better today. Her mood is improving, affect is brighter. She no longer thinks of abortion and wants to keep the baby. She hopes to go back to school to complete her GED's and get a job in the future. [facility] with haven't heard back from them as of yet. She is still vaguely suicidal when thinking of problems that she faces. She tolerates medications well. Good group participation. There are no somatic symptoms. Her mother is very supportive. She cares for her 2 children.  Principal Problem: Severe episode of recurrent major depressive disorder, without psychotic features (HCC) Diagnosis:   Patient Active Problem List   Diagnosis Date Noted  . Severe episode of recurrent major depressive disorder, without psychotic features (HCC) [F33.2]   . Pregnancy [Z33.1] 04/17/2015  . PTSD (post-traumatic stress disorder) [F43.10] 04/17/2015  . Homeless [Z59.0] 04/17/2015   Total Time spent with patient: 20 minutes  Past Psychiatric History: History of depression.  Past Medical History:  Past Medical History  Diagnosis Date  . Depression   . Heart palpitations    History reviewed. No pertinent past surgical history. Family History: History reviewed. No pertinent family history. Family Psychiatric  History: None reported. Social History:  History  Alcohol Use No     History  Drug Use No    Social History   Social History  . Marital Status: Single    Spouse Name: N/A  . Number of Children: N/A  . Years of Education: N/A   Social History Main Topics  . Smoking status: Never Smoker   . Smokeless tobacco: None  . Alcohol Use: No  . Drug Use: No  . Sexual Activity: Not Asked   Other Topics Concern  . None   Social History Narrative   Additional Social History:                         Sleep:  Good  Appetite:  Good  Current Medications: Current Facility-Administered Medications  Medication Dose Route Frequency Provider Last Rate Last Dose  . acetaminophen (TYLENOL) tablet 650 mg  650 mg Oral Q6H PRN Audery Amel, MD      . alum & mag hydroxide-simeth (MAALOX/MYLANTA) 200-200-20 MG/5ML suspension 30 mL  30 mL Oral Q4H PRN Audery Amel, MD      . diphenhydrAMINE (BENADRYL) capsule 50 mg  50 mg Oral QHS PRN Audery Amel, MD   50 mg at 04/20/15 2252  . FLUoxetine (PROZAC) capsule 20 mg  20 mg Oral Daily Franciso Dierks B Izmael Duross, MD   20 mg at 04/21/15 0930  . folic acid (FOLVITE) tablet 1 mg  1 mg Oral Daily Audery Amel, MD   1 mg at 04/21/15 0929  . magnesium hydroxide (MILK OF MAGNESIA) suspension 30 mL  30 mL Oral Daily PRN Audery Amel, MD      . prenatal multivitamin tablet 1 tablet  1 tablet Oral Q1200 Vena Austria, MD   1 tablet at 04/21/15 1251    Lab Results: No results found for this or any previous visit (from the past 48 hour(s)).  Physical Findings: AIMS:  , ,  ,  ,    CIWA:    COWS:     Musculoskeletal: Strength & Muscle Tone: within normal limits Gait &  Station: normal Patient leans: N/A  Psychiatric Specialty Exam: Review of Systems  All other systems reviewed and are negative.   Blood pressure 148/74, pulse 90, temperature 98.9 F (37.2 C), temperature source Oral, resp. rate 20, height  (1.651 m), weight 113.399 kg (250 lb), last menstrual period 01/18/2015, SpO2 99 %.Body mass index is 41.6 kg/(m^2).  General Appearance: Casual  Eye Contact::  Good  Speech:  Clear and Coherent  Volume:  Normal  Mood:  Depressed  Affect:  Flat  Thought Process:  Goal Directed  Orientation:  Full (Time, Place, and Person)  Thought Content:  WDL  Suicidal Thoughts:  Yes.  with intent/plan  Homicidal Thoughts:  No  Memory:  Immediate;   Fair Recent;   Fair Remote;   Fair  Judgement:  Fair  Insight:  Fair  Psychomotor Activity:  Normal   Concentration:  Fair  Recall:  [employer] of Knowledge:Fair  Language: Fair  Akathisia:  No  Handed:  Right  AIMS (if indicated):     Assets:  Communication Skills Desire for Improvement Physical Health Resilience Social Support  ADL's:  Intact  Cognition: WNL  Sleep:  Number of Hours: 6.5   Treatment Plan Summary: Daily contact with patient to assess and evaluate symptoms and progress in treatment and Medication management   Cathy Dunn is a 34 year old female with a history of postpartum depression admitted for suicidal ideation in the context of new pregnancy and multiple social stressors.  1. Suicidal ideation. The patient is able to contract for safety in the hospital.  2. Mood. She was started on Prozac for depression.  3. Prepregnancy. Ultrasound indicates no problems. She is on prenatal vitamins and folic acid.  4. Disposition. She will be discharged to [facility] if possible. She will follow-up with a local provider.  Eniya Cannady 04/21/2015, 3:53 PM 2

## 2015-04-21 NOTE — Plan of Care (Signed)
Problem: Diagnosis: Increased Risk For Suicide Attempt Goal: LTG-Patient Will Report Improved Mood and Deny Suicidal LTG (by discharge) Patient will report improved mood and deny suicidal ideation.  Outcome: Progressing Pt reports an improvement in her mood and denies SI.

## 2015-04-22 NOTE — Plan of Care (Signed)
Problem: Alteration in mood Goal: LTG-Patient reports reduction in suicidal thoughts (Patient reports reduction in suicidal thoughts and is able to verbalize a safety plan for whenever patient is feeling suicidal)  Outcome: Progressing Patient denies any suicidal ideation.  Goal: LTG-Pt's behavior demonstrates decreased signs of depression (Patient's behavior demonstrates decreased signs of depression to the point the patient is safe to return home and continue treatment in an outpatient setting)  Outcome: Progressing Patient has displayed a bright affect during the shift.
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
## 2015-04-22 NOTE — Progress Notes (Signed)
Patietn A&O xs4. Patient denies SI/HI/AV. Patient interacting with peers in day room. Patient given PRN Benadryl 50 mg for sleep with good result. Safety and Q 15 min checks maintained during shift will continue to monitor.

## 2015-04-22 NOTE — [facility] Group Notes (Signed)
[facility]   04/22/2015 10:26 AM  Participation Quality:  Patient attended group and reports she will discharge today and just wants to "return to school before my baby is born".   Mood/Affect:  Appropriate  Thoughts of Suicide:  No Will you contract for safety?   NA  Current AVH:  No  Plan for Discharge/Comments:  Patient plans todischarge home today and return to school    Lulu RidingIngle, Adja Ruff T, MSW, [employer]LCSWA

## 2015-04-22 NOTE — Progress Notes (Signed)
  [facility]  Will you be returning to the same living situation after discharge:  No. At discharge, do you have transportation home?: Yes,  Self  Do you have the ability to pay for your medications: Yes,  Insurance   Release of information consent forms completed and in the chart;  Patient's signature needed at discharge.  Patient to Follow up at: Follow-up Information    Follow up with Lorrene ReidSTAEBLER, ANDREAS M, MD. Schedule an appointment as soon as possible for a visit in 1 week.   Specialty:  Obstetrics and Gynecology   Why:  New OB visit   Contact information:   [address] [place] [address] [phone]       [facility]   Why:  Your first appointment will be walk in. Walk in hours: Monday- Friday 8:00am- 10:00am. Please take all hospital paper work, your ID and any insurance information.    Contact information:   [address] [place] [address] [phone]       Patient denies SI/HI: Yes,  Yes    Safety Planning and Suicide Prevention discussed: Yes,  With patient and sister   Have you used any form of tobacco in the last 30 days? (Cigarettes, Smokeless Tobacco, Cigars, and/or Pipes): No  Has patient been referred to the Quitline?: N/A patient is not a smoker  Semp[employer] EnergyCandace L Qadir Folks MSW, LCSWA  04/22/2015, 10:54 AM

## 2015-04-22 NOTE — Progress Notes (Signed)
D: Patient aware of discharge this shift. Patient denies suicidal and homicidal ideations.  A: Patient received discharge teaching and prescription given and aware of follow up appointments. Patient received personal belongings out of the locker.  R: Patient receptive to information received and verbalized understanding of teaching. Patient escorted out of the builing by RN.

## 2015-04-22 NOTE — BHH Suicide Risk Assessment (Signed)
B[facility] INPATIENT:  Family/Significant Other Suicide Prevention Education  Suicide Prevention Education:  Education Completed; Kerman PasseyLastassia Clark [phone] has been identified by the patient as the family member/significant other with whom the patient will be residing, and identified as the person(s) who will aid the patient in the event of a mental health crisis (suicidal ideations/suicide attempt).  With written consent from the patient, the family member/significant other has been provided the following suicide prevention education, prior to the and/or following the discharge of the patient.  The suicide prevention education provided includes the following:  Suicide risk factors  Suicide prevention and interventions  National Suicide Hotline telephone number  [facility] assessment telephone number  [facility] Emergency Assistance 911  [facility] and/or Residential Mobile Crisis Unit telephone number  Request made of family/significant other to:  Remove weapons (e.g., guns, rifles, knives), all items previously/currently identified as safety concern.    Remove drugs/medications (over-the-counter, prescriptions, illicit drugs), all items previously/currently identified as a safety concern.  The family member/significant other verbalizes understanding of the suicide prevention education information provided.  The family member/significant other agrees to remove the items of safety concern listed above.  Gagan Dillion L Jackolyn Geron MSW, LCSWA  04/22/2015, 9:58 AM

## 2015-04-22 NOTE — [facility] Suicide Risk Assessment (Signed)
[facility]   Demographic Factors:  Low socioeconomic status and Unemployed  Total Time spent with patient: 30 minutes  Musculoskeletal: Strength & Muscle Tone: within normal limits Gait & Station: normal Patient leans: N/A  Psychiatric Specialty Exam: Physical Exam  Nursing note and vitals reviewed.   Review of Systems  All other systems reviewed and are negative.   Blood pressure 143/86, pulse 108, temperature 98.8 F (37.1 C), temperature source Oral, resp. rate 20, height 5\' 5"  (1.651 m), weight 113.399 kg (250 lb), last menstrual period 01/18/2015, SpO2 99 %.Body mass index is 41.6 kg/(m^2).  General Appearance: Casual  Eye Contact::  Good  Speech:  Clear and Coherent409  Volume:  Normal  Mood:  Euthymic  Affect:  Appropriate  Thought Process:  Goal Directed  Orientation:  Full (Time, Place, and Person)  Thought Content:  WDL  Suicidal Thoughts:  No  Homicidal Thoughts:  No  Memory:  Immediate;   Fair Recent;   Fair Remote;   Fair  Judgement:  Fair  Insight:  Fair  Psychomotor Activity:  Normal  Concentration:  Fair  Recall:  [employer]Fair  Fund of Knowledge:Fair  Language: Fair  Akathisia:  No  Handed:  Right  AIMS (if indicated):     Assets:  Communication Skills Desire for Improvement Financial Resources/Insurance Physical Health Resilience Social Support  Sleep:  Number of Hours: 7  Cognition: WNL  ADL's:  Intact   Have you used any form of tobacco in the last 30 days? (Cigarettes, Smokeless Tobacco, Cigars, and/or Pipes): No  Has this patient used any form of tobacco in the last 30 days? (Cigarettes, Smokeless Tobacco, Cigars, and/or Pipes) No  Mental Status Per Nursing Assessment::   On Admission:     Current Mental Status by Physician: NA  Loss Factors: Financial problems/change in socioeconomic status  Historical Factors: Impulsivity  Risk Reduction Factors:   Pregnancy, Responsible for children under 18 years of  age, Sense of responsibility to family and Positive social support  Continued Clinical Symptoms:  Depression:   Impulsivity  Cognitive Features That Contribute To Risk:  None    Suicide Risk:  Minimal: No identifiable suicidal ideation.  Patients presenting with no risk factors but with morbid ruminations; may be classified as minimal risk based on the severity of the depressive symptoms  Principal Problem: Severe episode of recurrent major depressive disorder, without psychotic features [facility](HCC) Discharge Diagnoses:  Patient Active Problem List   Diagnosis Date Noted  . Severe episode of recurrent major depressive disorder, without psychotic features (HCC) [F33.2]   . Pregnancy [Z33.1] 04/17/2015  . PTSD (post-traumatic stress disorder) [F43.10] 04/17/2015  . Homeless [Z59.0] 04/17/2015    Follow-up Information    Follow up with Lorrene ReidSTAEBLER, ANDREAS M, MD. Schedule an appointment as soon as possible for a visit in 1 week.   Specialty:  Obstetrics and Gynecology   Why:  New OB visit   Contact information:   [address] [place] [address] [phone]       Plan Of Care/Follow-up recommendations:  Activity:  As tolerated. Diet:  Low sodium heart healthy. Other:  Keep follow-up appointments.  Is patient on multiple antipsychotic therapies at discharge:  No   Has Patient had three or more failed trials of antipsychotic monotherapy by history:  No  Recommended Plan for Multiple Antipsychotic Therapies: NA    Sharline Lehane 04/22/2015, 9:40 AM

## 2015-04-22 NOTE — Progress Notes (Deleted)
  [facility]  Will you be returning to the same living situation after discharge:  No. At discharge, do you have transportation home?: Yes,  Self  Do you have the ability to pay for your medications: Yes,  Insurance  Release of information consent forms completed and in the chart;  Patient's signature needed at discharge.  Patient to Follow up at: Follow-up Information    Follow up with Lorrene ReidSTAEBLER, ANDREAS M, MD. Schedule an appointment as soon as possible for a visit in 1 week.   Specialty:  Obstetrics and Gynecology   Why:  New OB visit   Contact information:   [address] [place] [address] [phone]       Patient denies SI/HI: Yes,  Yes    Safety Planning and Suicide Prevention discussed: Yes,  With patient and sister   Have you used any form of tobacco in the last 30 days? (Cigarettes, Smokeless Tobacco, Cigars, and/or Pipes): No  Has patient been referred to the Quitline?: N/A patient is not a smoker  Semp[employer] EnergyCandace L Jermiyah Ricotta MSW, LCSWA  04/22/2015, 10:06 AM

## 2015-04-22 NOTE — BHH Group Notes (Signed)
B[facility] Group Notes:  (Nursing/MHT/Case Management/Adjunct)  Date:  04/22/2015  Time:  3:56 AM  Type of Therapy:  Group Therapy  Participation Level:  Active  Participation Quality:  Appropriate and Attentive  Affect:  Appropriate  Cognitive:  Appropriate  Insight:  Good and Improving  Engagement in Group:  Developing/Improving  Modes of Intervention:  n/a  Summary of Progress/Problems:  Cathy Dunn 04/22/2015, 3:56 AM

## 2016-02-09 HISTORY — PX: TUBAL LIGATION: SHX77

## 2017-01-25 DIAGNOSIS — B9689 Other specified bacterial agents as the cause of diseases classified elsewhere: Secondary | ICD-10-CM

## 2017-01-25 HISTORY — DX: Other specified bacterial agents as the cause of diseases classified elsewhere: B96.89

## 2017-11-23 DIAGNOSIS — F439 Reaction to severe stress, unspecified: Secondary | ICD-10-CM | POA: Insufficient documentation

## 2017-11-29 ENCOUNTER — Encounter: Payer: Self-pay | Admitting: Obstetrics & Gynecology

## 2017-12-12 ENCOUNTER — Encounter: Payer: Self-pay | Admitting: Obstetrics & Gynecology

## 2017-12-21 ENCOUNTER — Encounter: Payer: Self-pay | Admitting: Women's Health

## 2017-12-27 ENCOUNTER — Encounter: Payer: Self-pay | Admitting: *Deleted

## 2018-01-08 ENCOUNTER — Encounter: Payer: Self-pay | Admitting: Adult Health

## 2018-02-21 ENCOUNTER — Encounter: Payer: Medicaid Other | Admitting: Adult Health

## 2018-03-06 ENCOUNTER — Encounter: Payer: Medicaid Other | Admitting: Adult Health

## 2018-03-30 ENCOUNTER — Encounter: Payer: Medicaid Other | Admitting: Adult Health

## 2018-05-01 ENCOUNTER — Other Ambulatory Visit: Payer: Self-pay

## 2018-05-01 ENCOUNTER — Emergency Department (HOSPITAL_COMMUNITY)
Admission: EM | Admit: 2018-05-01 | Discharge: 2018-05-01 | Disposition: A | Discharge status: Home / Self Care | Payer: Medicaid Other | Attending: Emergency Medicine | Admitting: Emergency Medicine

## 2018-05-01 ENCOUNTER — Encounter (HOSPITAL_COMMUNITY): Payer: Self-pay | Admitting: Emergency Medicine

## 2018-05-01 DIAGNOSIS — J069 Acute upper respiratory infection, unspecified: Secondary | ICD-10-CM

## 2018-05-01 DIAGNOSIS — B349 Viral infection, unspecified: Secondary | ICD-10-CM | POA: Diagnosis not present

## 2018-05-01 DIAGNOSIS — J029 Acute pharyngitis, unspecified: Secondary | ICD-10-CM | POA: Diagnosis present

## 2018-05-01 DIAGNOSIS — Z590 Homelessness: Secondary | ICD-10-CM | POA: Diagnosis not present

## 2018-05-01 DIAGNOSIS — Z79899 Other long term (current) drug therapy: Secondary | ICD-10-CM | POA: Diagnosis not present

## 2018-05-01 LAB — GROUP A STREP BY PCR: GROUP A STREP BY PCR: NOT DETECTED

## 2018-05-01 MED ORDER — LORATADINE-PSEUDOEPHEDRINE ER 5-120 MG PO TB12: 1.0000 | ORAL_TABLET | Freq: Two times a day (BID) | ORAL | 0 refills | Status: DC

## 2018-05-01 MED ORDER — DEXTROMETHORPHAN POLISTIREX ER 30 MG/5ML PO SUER: 30.0000 mg | Freq: Two times a day (BID) | ORAL | 0 refills | Status: DC

## 2018-05-01 MED ORDER — IBUPROFEN 600 MG PO TABS
600.0000 mg | ORAL_TABLET | Freq: Four times a day (QID) | ORAL | 0 refills | Status: DC
Start: 2018-05-01 — End: 2018-07-19

## 2018-05-01 NOTE — Discharge Instructions (Addendum)
Salt water gargles may be helpful.  Your strep test was negative.  Please use Delsym 2 times daily for cough, Claritin-D 2 times daily for congestion, and ibuprofen every 6 hours for body aches.  Please increase fluids.  Wash hands frequently.  See your primary Medicaid access physician for additional evaluation and management.

## 2018-05-01 NOTE — ED Provider Notes (Signed)
[facility] EMERGENCY DEPARTMENT Provider Note   CSN: [id number] Arrival date & time: 05/01/18  4098     History   Chief Complaint Chief Complaint  Patient presents with  . Sore Throat    HPI Cathy Dunn is a 37 y.o. female.  The history is provided by the patient.  Sore Throat  The current episode started 6 to 12 hours ago. The problem occurs constantly. The problem has been gradually worsening. Associated symptoms include headaches. Pertinent negatives include no chest pain, no abdominal pain and no shortness of breath. Associated symptoms comments: Dizziness, cough, . The symptoms are aggravated by swallowing. Nothing relieves the symptoms. She has tried nothing for the symptoms.    Past Medical History:  Diagnosis Date  . BV (bacterial vaginosis) 01/25/2017  . Depression   . Genital warts 2008  . Heart palpitations   . Scoliosis    childhood    Patient Active Problem List   Diagnosis Date Noted  . Severe episode of recurrent major depressive disorder, without psychotic features (HCC)   . Pregnancy 04/17/2015  . PTSD (post-traumatic stress disorder) 04/17/2015  . Homeless 04/17/2015    Past Surgical History:  Procedure Laterality Date  . SPINAL FUSION    . TUBAL LIGATION  02/2016     OB History    Gravida  4   Para  4   Term      Preterm      AB      Living  4     SAB      TAB      Ectopic      Multiple      Live Births               Home Medications    Prior to Admission medications   Medication Sig Start Date End Date Taking? Authorizing Provider  FLUoxetine (PROZAC) 20 MG capsule Take 1 capsule (20 mg total) by mouth daily. 04/21/15   PucilowskaEllin Goodie, MD    Family History Family History  Problem Relation Age of Onset  . Hypertension Mother   . Hypertension Father   . Heart disease Father     Social History Social History   Tobacco Use  . Smoking status: Never Smoker  . Smokeless tobacco: Never Used    Substance Use Topics  . Alcohol use: No  . Drug use: No     Allergies   Patient has no known allergies.   Review of Systems Review of Systems  Constitutional: Positive for activity change and appetite change.       All ROS Neg except as noted in HPI  HENT: Positive for congestion. Negative for nosebleeds.   Eyes: Negative for photophobia and discharge.  Respiratory: Negative for cough, shortness of breath and wheezing.   Cardiovascular: Negative for chest pain and palpitations.  Gastrointestinal: Negative for abdominal pain and blood in stool.  Genitourinary: Negative for dysuria, frequency and hematuria.  Musculoskeletal: Positive for myalgias. Negative for arthralgias, back pain and neck pain.  Skin: Negative.   Neurological: Positive for headaches. Negative for dizziness, seizures and speech difficulty.  Psychiatric/Behavioral: Negative for confusion and hallucinations.     Physical Exam Updated Vital Signs BP (!) 138/91 (BP Location: Right Arm)   Pulse 98   Temp 97.7 F (36.5 C) (Oral)   Resp 18   Ht 5\' 5"  (1.651 m)   Wt 113.4 kg   LMP 04/10/2018   SpO2 100%   BMI  41.60 kg/m   Physical Exam  Constitutional: She is oriented to person, place, and time. She appears well-developed and well-nourished.  Non-toxic appearance.  HENT:  Head: Normocephalic.  Right Ear: Tympanic membrane and external ear normal.  Left Ear: Tympanic membrane and external ear normal.  Nasal congestion. Uvula enlarged.  Eyes: Pupils are equal, round, and reactive to light. EOM and lids are normal.  Neck: Normal range of motion. Neck supple. Carotid bruit is not present.  Cardiovascular: Normal rate, regular rhythm, normal heart sounds, intact distal pulses and normal pulses.  Pulmonary/Chest: Breath sounds normal. No respiratory distress.  Abdominal: Soft. Bowel sounds are normal. There is no tenderness. There is no guarding.  Musculoskeletal: Normal range of motion.  Lymphadenopathy:        Head (right side): No submandibular adenopathy present.       Head (left side): No submandibular adenopathy present.    She has no cervical adenopathy.  Neurological: She is alert and oriented to person, place, and time. She has normal strength. No cranial nerve deficit or sensory deficit.  Skin: Skin is warm and dry.  Psychiatric: She has a normal mood and affect. Her speech is normal.  Nursing note and vitals reviewed.    ED Treatments / Results  Labs (all labs ordered are listed, but only abnormal results are displayed) Labs Reviewed  GROUP A STREP BY PCR    EKG None  Radiology No results found.  Procedures Procedures (including critical care time)  Medications Ordered in ED Medications - No data to display   Initial Impression / Assessment and Plan / ED Course  I have reviewed the triage vital signs and the nursing notes.  Pertinent labs & imaging results that were available during my care of the patient were reviewed by me and considered in my medical decision making (see chart for details).      Final Clinical Impressions(s) / ED Diagnoses MDM  Vital signs reviewed.  Group A strep is negative.  The examination favors an upper respiratory infection.  I have asked the patient to increase fluids, use Tylenol and ibuprofen for soreness and fever.  Prescription for Delsym given for cough, Claritin-D for congestion.  Patient to see the primary physician, or return to the emergency department if any changes in condition, problems, or concerns.   Final diagnoses:  Viral pharyngitis  Viral upper respiratory tract infection    ED Discharge Orders         Ordered    ibuprofen (ADVIL,MOTRIN) 600 MG tablet  4 times daily     05/01/18 1258    loratadine-pseudoephedrine (CLARITIN-D 12 HOUR) 5-120 MG tablet  2 times daily     05/01/18 1258    dextromethorphan (DELSYM) 30 MG/5ML liquid  2 times daily     05/01/18 1258           Ivery Quale, PA-C 05/01/18  1308    Pricilla Loveless, MD 05/01/18 1458

## 2018-05-01 NOTE — ED Notes (Signed)
Gave patient warm blanket as requested.  

## 2018-05-01 NOTE — ED Triage Notes (Signed)
Pt reports child sick as well. Pt states sore throat this am with weakness.

## 2018-06-28 ENCOUNTER — Encounter (HOSPITAL_COMMUNITY): Payer: Self-pay | Admitting: Emergency Medicine

## 2018-06-28 ENCOUNTER — Emergency Department (HOSPITAL_COMMUNITY)
Admission: EM | Admit: 2018-06-28 | Discharge: 2018-06-28 | Disposition: A | Discharge status: Home / Self Care | Payer: Medicaid Other | Attending: Emergency Medicine | Admitting: Emergency Medicine

## 2018-06-28 ENCOUNTER — Other Ambulatory Visit: Payer: Self-pay

## 2018-06-28 DIAGNOSIS — D649 Anemia, unspecified: Secondary | ICD-10-CM | POA: Diagnosis not present

## 2018-06-28 DIAGNOSIS — N939 Abnormal uterine and vaginal bleeding, unspecified: Secondary | ICD-10-CM | POA: Diagnosis not present

## 2018-06-28 LAB — BASIC METABOLIC PANEL
Anion gap: 6 (ref 5–15)
BUN: 13 mg/dL (ref 6–20)
CALCIUM: 9 mg/dL (ref 8.9–10.3)
CO2: 24 mmol/L (ref 22–32)
CREATININE: 0.67 mg/dL (ref 0.44–1.00)
Chloride: 108 mmol/L (ref 98–111)
GFR calc non Af Amer: >60 mL/min (ref >60)
Glucose, Bld: 96 mg/dL (ref 70–99)
Potassium: 3.5 mmol/L (ref 3.5–5.1)
Sodium: 138 mmol/L (ref 135–145)

## 2018-06-28 LAB — CBC WITH DIFFERENTIAL/PLATELET
Abs Immature Granulocytes: 0.01 10*3/uL (ref 0.00–0.07)
BASOS ABS: 0 10*3/uL (ref 0.0–0.1)
Basophils Relative: 0 %
EOS ABS: 0.3 10*3/uL (ref 0.0–0.5)
EOS PCT: 6 %
HCT: 32.9 % — ABNORMAL LOW (ref 36.0–46.0)
HEMOGLOBIN: 9.8 g/dL — AB (ref 12.0–15.0)
Immature Granulocytes: 0 %
LYMPHS PCT: 40 %
Lymphs Abs: 1.8 10*3/uL (ref 0.7–4.0)
MCH: 23.9 pg — ABNORMAL LOW (ref 26.0–34.0)
MCHC: 29.8 g/dL — AB (ref 30.0–36.0)
MCV: 80.2 fL (ref 80.0–100.0)
Monocytes Absolute: 0.3 10*3/uL (ref 0.1–1.0)
Monocytes Relative: 7 %
NEUTROS PCT: 47 %
Neutro Abs: 2.1 10*3/uL (ref 1.7–7.7)
Platelets: 429 10*3/uL — ABNORMAL HIGH (ref 150–400)
RBC: 4.1 MIL/uL (ref 3.87–5.11)
RDW: 15.8 % — AB (ref 11.5–15.5)
WBC: 4.6 10*3/uL (ref 4.0–10.5)
nRBC: 0 % (ref 0.0–0.2)

## 2018-06-28 LAB — WET PREP, GENITAL
CLUE CELLS WET PREP: NONE SEEN
SPERM: NONE SEEN
Trich, Wet Prep: NONE SEEN
Yeast Wet Prep HPF POC: NONE SEEN

## 2018-06-28 LAB — HCG, QUANTITATIVE, PREGNANCY: hCG, Beta Chain, Quant, S: <1 m[IU]/mL (ref <5)

## 2018-06-28 NOTE — ED Triage Notes (Addendum)
Patient states vaginal bleeding that started today. Patient states she tried using a home made douche made from bottle water and apple cider vinegar. Afterwards she noticed cramping and then bleeding this morning.

## 2018-06-28 NOTE — Discharge Instructions (Addendum)
You are anemic.  Tests for gonorrhea, chlamydia, HIV are pending.  You are not pregnant.  Follow-up with local gynecologist.  Phone number given.  Recommend multivitamin with iron.

## 2018-06-29 LAB — HIV ANTIBODY (ROUTINE TESTING W REFLEX): HIV Screen 4th Generation wRfx: NONREACTIVE

## 2018-06-29 LAB — GC/CHLAMYDIA PROBE AMP (~~LOC~~) NOT AT ARMC
Chlamydia: NEGATIVE
Neisseria Gonorrhea: NEGATIVE

## 2018-06-29 NOTE — ED Provider Notes (Signed)
[facility] EMERGENCY DEPARTMENT Provider Note   CSN: [id number] Arrival date & time: 06/28/18  1024     History   Chief Complaint Chief Complaint  Patient presents with  . Vaginal Bleeding    HPI Cathy Dunn is a 37 y.o. female.  Presents with a concern about vaginal bleeding which started today.  Additionally, she states her boyfriend has cheated on her and she is worried about an STD and possibly being pregnant.  Status post BTL.  Last menstrual period 06/12/2018.  She has had one pad change today with her bleeding.  No known previous history of anemia.  Severity is mild.  Nothing makes symptoms better or worse     Past Medical History:  Diagnosis Date  . BV (bacterial vaginosis) 01/25/2017  . Depression   . Genital warts 2008  . Heart palpitations   . Scoliosis    childhood    Patient Active Problem List   Diagnosis Date Noted  . Severe episode of recurrent major depressive disorder, without psychotic features (HCC)   . Pregnancy 04/17/2015  . PTSD (post-traumatic stress disorder) 04/17/2015  . Homeless 04/17/2015    Past Surgical History:  Procedure Laterality Date  . SPINAL FUSION    . TUBAL LIGATION  02/2016     OB History    Gravida  4   Para  4   Term      Preterm      AB      Living  4     SAB      TAB      Ectopic      Multiple      Live Births               Home Medications    Prior to Admission medications   Medication Sig Start Date End Date Taking? Authorizing Provider  dextromethorphan (DELSYM) 30 MG/5ML liquid Take 5 mLs (30 mg total) by mouth 2 (two) times daily. Patient not taking: Reported on 06/28/2018 05/01/18   Ivery QualeBryant, Hobson, PA-C  ibuprofen (ADVIL,MOTRIN) 600 MG tablet Take 1 tablet (600 mg total) by mouth 4 (four) times daily. Patient not taking: Reported on 06/28/2018 05/01/18   Ivery QualeBryant, Hobson, PA-C  loratadine-pseudoephedrine (CLARITIN-D 12 HOUR) 5-120 MG tablet Take 1 tablet by mouth 2 (two)  times daily. Patient not taking: Reported on 06/28/2018 05/01/18   Ivery QualeBryant, Hobson, PA-C    Family History Family History  Problem Relation Age of Onset  . Hypertension Mother   . Hypertension Father   . Heart disease Father     Social History Social History   Tobacco Use  . Smoking status: Never Smoker  . Smokeless tobacco: Never Used  Substance Use Topics  . Alcohol use: No  . Drug use: No     Allergies   Patient has no known allergies.   Review of Systems Review of Systems  All other systems reviewed and are negative.    Physical Exam Updated Vital Signs BP 131/87 (BP Location: Left Arm)   Pulse 88   Temp 97.8 F (36.6 C) (Oral)   Ht 5\' 5"  (1.651 m)   Wt 112.5 kg   LMP 06/12/2018   SpO2 100%   BMI 41.27 kg/m   Physical Exam Vitals signs and nursing note reviewed.  Constitutional:      Appearance: She is well-developed.     Comments: nad  HENT:     Head: Normocephalic and atraumatic.  Eyes:  Conjunctiva/sclera: Conjunctivae normal.  Neck:     Musculoskeletal: Neck supple.  Cardiovascular:     Rate and Rhythm: Normal rate and regular rhythm.  Pulmonary:     Effort: Pulmonary effort is normal.     Breath sounds: Normal breath sounds.  Abdominal:     General: Bowel sounds are normal.     Palpations: Abdomen is soft.  Genitourinary:    Comments: Pelvic exam: Normal external genitalia.  No adnexal tenderness.  No frank bleeding.  Minimal cervical motion tenderness. Musculoskeletal: Normal range of motion.  Skin:    General: Skin is warm and dry.  Neurological:     Mental Status: She is alert and oriented to person, place, and time.  Psychiatric:        Behavior: Behavior normal.      ED Treatments / Results  Labs (all labs ordered are listed, but only abnormal results are displayed) Labs Reviewed  WET PREP, GENITAL - Abnormal; Notable for the following components:      Result Value   WBC, Wet Prep HPF POC RARE (*)    All other  components within normal limits  CBC WITH DIFFERENTIAL/PLATELET - Abnormal; Notable for the following components:   Hemoglobin 9.8 (*)    HCT 32.9 (*)    MCH 23.9 (*)    MCHC 29.8 (*)    RDW 15.8 (*)    Platelets 429 (*)    All other components within normal limits  HIV ANTIBODY (ROUTINE TESTING W REFLEX)  HCG, QUANTITATIVE, PREGNANCY  BASIC METABOLIC PANEL  GC/CHLAMYDIA PROBE AMP ([place]) NOT AT [facility]    EKG None  Radiology No results found.  Procedures Procedures (including critical care time)  Medications Ordered in ED Medications - No data to display   Initial Impression / Assessment and Plan / ED Course  I have reviewed the triage vital signs and the nursing notes.  Pertinent labs & imaging results that were available during my care of the patient were reviewed by me and considered in my medical decision making (see chart for details).     Patient presents with vaginal bleeding and concern about STD/pregnancy.  Screening tests include negative pregnancy, negative wet prep, negative HIV.  GC chlamydia pending.  She is anemic.  This was discussed with the patient.  She will start a multivitamin with iron.  No acute distress at discharge.  Final Clinical Impressions(s) / ED Diagnoses   Final diagnoses:  Vagina bleeding  Anemia, unspecified type    ED Discharge Orders    None       Donnetta Hutchingook, Keyshaun Exley, MD 06/29/18 1110

## 2018-07-19 ENCOUNTER — Encounter (HOSPITAL_COMMUNITY): Payer: Self-pay

## 2018-07-19 ENCOUNTER — Other Ambulatory Visit: Payer: Self-pay

## 2018-07-19 ENCOUNTER — Emergency Department (HOSPITAL_COMMUNITY)
Admission: EM | Admit: 2018-07-19 | Discharge: 2018-07-19 | Disposition: A | Discharge status: Home / Self Care | Payer: Medicaid Other | Attending: Emergency Medicine | Admitting: Emergency Medicine

## 2018-07-19 DIAGNOSIS — N898 Other specified noninflammatory disorders of vagina: Secondary | ICD-10-CM

## 2018-07-19 DIAGNOSIS — J069 Acute upper respiratory infection, unspecified: Secondary | ICD-10-CM | POA: Diagnosis not present

## 2018-07-19 LAB — URINALYSIS, ROUTINE W REFLEX MICROSCOPIC
Bacteria, UA: NONE SEEN
Bilirubin Urine: NEGATIVE
GLUCOSE, UA: NEGATIVE mg/dL
Hgb urine dipstick: NEGATIVE
Ketones, ur: NEGATIVE mg/dL
Nitrite: NEGATIVE
Protein, ur: NEGATIVE mg/dL
Specific Gravity, Urine: 1.027 (ref 1.005–1.030)
pH: 5 (ref 5.0–8.0)

## 2018-07-19 LAB — INFLUENZA PANEL BY PCR (TYPE A & B)
Influenza A By PCR: NEGATIVE
Influenza B By PCR: NEGATIVE

## 2018-07-19 LAB — WET PREP, GENITAL
Clue Cells Wet Prep HPF POC: NONE SEEN
Sperm: NONE SEEN
TRICH WET PREP: NONE SEEN
WBC, Wet Prep HPF POC: NONE SEEN
Yeast Wet Prep HPF POC: NONE SEEN

## 2018-07-19 LAB — POC URINE PREG, ED: Preg Test, Ur: NEGATIVE

## 2018-07-19 MED ORDER — SODIUM CHLORIDE 0.9 % IV BOLUS: 1000.0000 mL | Freq: Once | INTRAVENOUS | Status: DC

## 2018-07-19 NOTE — ED Provider Notes (Signed)
[facility] EMERGENCY DEPARTMENT Provider Note   CSN: [id number] Arrival date & time: 07/19/18  3818     History   Chief Complaint Chief Complaint  Patient presents with  . Fever  . Vaginal Discharge    HPI Cathy Dunn is a 38 y.o. female.  Pt reports flu like symptoms for the last 3 days. Pt reports fever, dizziness, chills. Pt states she feels like her boyfriend may have given her herpes. She Googled her symptoms and found that herpes can give you fever and chills. Pt diagnosed with BV  And treated with flagyl at another ED. Pt very concerned that something else is going on,.  The history is provided by the patient.  Fever  Associated symptoms: congestion, cough and dysuria   Associated symptoms: no chest pain, no confusion and no sore throat   Vaginal Discharge  Associated symptoms: dysuria and fever   Associated symptoms: no abdominal pain     Past Medical History:  Diagnosis Date  . BV (bacterial vaginosis) 01/25/2017  . Depression   . Genital warts 2008  . Heart palpitations   . Scoliosis    childhood    Patient Active Problem List   Diagnosis Date Noted  . Severe episode of recurrent major depressive disorder, without psychotic features (HCC)   . Pregnancy 04/17/2015  . PTSD (post-traumatic stress disorder) 04/17/2015  . Homeless 04/17/2015    Past Surgical History:  Procedure Laterality Date  . SPINAL FUSION    . TUBAL LIGATION  02/2016     OB History    Gravida  4   Para  4   Term      Preterm      AB      Living  4     SAB      TAB      Ectopic      Multiple      Live Births               Home Medications    Prior to Admission medications   Not on File    Family History Family History  Problem Relation Age of Onset  . Hypertension Mother   . Hypertension Father   . Heart disease Father     Social History Social History   Tobacco Use  . Smoking status: Never Smoker  . Smokeless tobacco: Never Used    Substance Use Topics  . Alcohol use: No  . Drug use: No     Allergies   Patient has no known allergies.   Review of Systems Review of Systems  Constitutional: Positive for fever. Negative for activity change.       All ROS Neg except as noted in HPI  HENT: Positive for congestion. Negative for nosebleeds and sore throat.   Eyes: Negative for photophobia and discharge.  Respiratory: Positive for cough. Negative for shortness of breath and wheezing.   Cardiovascular: Negative for chest pain and palpitations.  Gastrointestinal: Negative for abdominal pain and blood in stool.  Genitourinary: Positive for dysuria, vaginal discharge and vaginal pain. Negative for frequency and hematuria.  Musculoskeletal: Negative for arthralgias, back pain and neck pain.  Skin: Negative.   Neurological: Positive for dizziness. Negative for seizures and speech difficulty.  Psychiatric/Behavioral: Negative for confusion and hallucinations.     Physical Exam Updated Vital Signs BP (!) 151/98 (BP Location: Left Arm)   Pulse (!) 102   Temp 98.5 F (36.9 C) (Oral)   Resp 12  LMP 07/06/2018   SpO2 100%   Physical Exam Vitals signs and nursing note reviewed. Exam conducted with a chaperone present.  Constitutional:      Appearance: She is well-developed. She is not toxic-appearing.  HENT:     Head: Normocephalic.     Right Ear: Tympanic membrane and external ear normal.     Left Ear: Tympanic membrane and external ear normal.     Nose: Congestion present.  Eyes:     General: Lids are normal.     Pupils: Pupils are equal, round, and reactive to light.  Neck:     Musculoskeletal: Normal range of motion and neck supple.     Vascular: No carotid bruit.  Cardiovascular:     Rate and Rhythm: Regular rhythm. Tachycardia present.     Pulses: Normal pulses.     Heart sounds: Normal heart sounds.  Pulmonary:     Effort: No respiratory distress.     Breath sounds: Normal breath sounds.   Abdominal:     General: Bowel sounds are normal.     Palpations: Abdomen is soft.     Tenderness: There is no abdominal tenderness. There is no guarding.  Genitourinary:    Exam position: Supine.     Pubic Area: No rash.      Labia:        Right: Lesion present.        Left: Lesion present.      Comments: There is to blister type areas near the opening of the vagina of the left labia, 1 open blister area at the floor of the vagina, and to open blister areas on the labia at the opening of the right side of the vagina. Musculoskeletal: Normal range of motion.  Lymphadenopathy:     Head:     Right side of head: No submandibular adenopathy.     Left side of head: No submandibular adenopathy.     Cervical: No cervical adenopathy.     Lower Body: Right inguinal adenopathy present. Left inguinal adenopathy present.  Skin:    General: Skin is warm and dry.  Neurological:     Mental Status: She is alert and oriented to person, place, and time.     Cranial Nerves: No cranial nerve deficit.     Sensory: No sensory deficit.  Psychiatric:        Speech: Speech normal.      ED Treatments / Results  Labs (all labs ordered are listed, but only abnormal results are displayed) Labs Reviewed  URINALYSIS, ROUTINE W REFLEX MICROSCOPIC - Abnormal; Notable for the following components:      Result Value   Leukocytes, UA TRACE (*)    All other components within normal limits  INFLUENZA PANEL BY PCR (TYPE A & B)  POC URINE PREG, ED    EKG None  Radiology No results found.  Procedures Procedures (including critical care time)  Medications Ordered in ED Medications - No data to display   Initial Impression / Assessment and Plan / ED Course  I have reviewed the triage vital signs and the nursing notes.  Pertinent labs & imaging results that were available during my care of the patient were reviewed by me and considered in my medical decision making (see chart for details).        Final Clinical Impressions(s) / ED Diagnoses MDM  Vital signs reviewed. Urine pregnancy test is negative.  Urine analysis is negative. Influenza test is negative.  Wet prep is  negative for yeast, trichomonas, bacterial vaginosis.  I discussed gust these findings with the patient in terms of which she understands.  The patient has HIV, RPR, and herpes simplex 2 titer pending.  I discussed with the patient that this rash that she is experiencing may possibly be herpes simplex 1.  I further explained to the patient is someone from the flow managers office at the [facility] would call with very abnormalities of her test.   Final diagnoses:  Vaginal discharge  Upper respiratory tract infection, unspecified type    ED Discharge Orders    None       Ivery Quale, PA-C 07/19/18 2240    Bethann Berkshire, MD 07/21/18 1359

## 2018-07-19 NOTE — Discharge Instructions (Addendum)
Your examination favors an upper respiratory infection.  Your influenza test is negative.  The wet prep is negative for trichomonas, yeast infection, or bacterial vaginosis.  Cultures are pending, lab work for other related problems also pending over the next few days.  Please use the decongestant of your choice.  Use Tylenol every 4 hours, use ibuprofen every 6 hours for fever or aching.  Please increase fluids.  Please wash hands frequently.  Someone from the flow managers office will call you within the next few days with the results of your remaining test.

## 2018-07-19 NOTE — ED Triage Notes (Signed)
Pt states she thinks she may have the flu. Has been having fever and chills. Was seen at [facility] 3 days ago for vaginal discharge. States she was diagnosed with BV and given Flagyl. Now states she is having increased watery discharge and states her vaginal area is tender. Is worried she may have herpes. States she sees no warts.

## 2018-07-20 LAB — HSV 2 ANTIBODY, IGG: HSV 2 Glycoprotein G Ab, IgG: <0.91 index (ref 0.00–0.90)

## 2018-07-20 LAB — GC/CHLAMYDIA PROBE AMP (~~LOC~~) NOT AT ARMC
Chlamydia: NEGATIVE
NEISSERIA GONORRHEA: NEGATIVE

## 2018-07-20 LAB — RPR: RPR Ser Ql: NONREACTIVE

## 2018-07-20 LAB — HIV ANTIBODY (ROUTINE TESTING W REFLEX): HIV Screen 4th Generation wRfx: NONREACTIVE

## 2018-07-22 ENCOUNTER — Other Ambulatory Visit: Payer: Self-pay

## 2018-07-22 ENCOUNTER — Emergency Department (HOSPITAL_COMMUNITY)
Admission: EM | Admit: 2018-07-22 | Discharge: 2018-07-22 | Disposition: A | Discharge status: Home / Self Care | Payer: Medicaid Other | Attending: Emergency Medicine | Admitting: Emergency Medicine

## 2018-07-22 ENCOUNTER — Emergency Department (HOSPITAL_COMMUNITY): Payer: Medicaid Other

## 2018-07-22 ENCOUNTER — Encounter (HOSPITAL_COMMUNITY): Payer: Self-pay

## 2018-07-22 DIAGNOSIS — R509 Fever, unspecified: Secondary | ICD-10-CM | POA: Diagnosis present

## 2018-07-22 DIAGNOSIS — N9089 Other specified noninflammatory disorders of vulva and perineum: Secondary | ICD-10-CM | POA: Insufficient documentation

## 2018-07-22 DIAGNOSIS — F329 Major depressive disorder, single episode, unspecified: Secondary | ICD-10-CM | POA: Diagnosis not present

## 2018-07-22 DIAGNOSIS — M419 Scoliosis, unspecified: Secondary | ICD-10-CM | POA: Diagnosis not present

## 2018-07-22 DIAGNOSIS — N3090 Cystitis, unspecified without hematuria: Secondary | ICD-10-CM | POA: Diagnosis not present

## 2018-07-22 DIAGNOSIS — I951 Orthostatic hypotension: Secondary | ICD-10-CM | POA: Diagnosis not present

## 2018-07-22 DIAGNOSIS — Z590 Homelessness: Secondary | ICD-10-CM | POA: Diagnosis not present

## 2018-07-22 LAB — URINALYSIS, ROUTINE W REFLEX MICROSCOPIC
Bacteria, UA: NONE SEEN
Bilirubin Urine: NEGATIVE
Glucose, UA: NEGATIVE mg/dL
Ketones, ur: 5 mg/dL — AB
NITRITE: NEGATIVE
PH: 5 (ref 5.0–8.0)
Protein, ur: 100 mg/dL — AB
Specific Gravity, Urine: 1.028 (ref 1.005–1.030)
WBC, UA: >50 WBC/hpf — ABNORMAL HIGH (ref 0–5)

## 2018-07-22 LAB — PREGNANCY, URINE: Preg Test, Ur: NEGATIVE

## 2018-07-22 MED ORDER — ACYCLOVIR 800 MG PO TABS
400.0000 mg | ORAL_TABLET | Freq: Once | ORAL | Status: AC
  Administered 2018-07-22: 400 mg via ORAL
  Filled 2018-07-22: qty 1

## 2018-07-22 MED ORDER — SODIUM CHLORIDE 0.9 % IV BOLUS
1000.0000 mL | Freq: Once | INTRAVENOUS | Status: AC
  Administered 2018-07-22: 1000 mL via INTRAVENOUS

## 2018-07-22 MED ORDER — IBUPROFEN 400 MG PO TABS
400.0000 mg | ORAL_TABLET | Freq: Once | ORAL | Status: AC
  Administered 2018-07-22: 400 mg via ORAL
  Filled 2018-07-22: qty 1

## 2018-07-22 MED ORDER — CEPHALEXIN 500 MG PO CAPS: 500.0000 mg | ORAL_CAPSULE | Freq: Four times a day (QID) | ORAL | 0 refills | Status: DC

## 2018-07-22 MED ORDER — ACETAMINOPHEN 325 MG PO TABS
650.0000 mg | ORAL_TABLET | Freq: Once | ORAL | Status: AC
  Administered 2018-07-22: 650 mg via ORAL
  Filled 2018-07-22: qty 2

## 2018-07-22 MED ORDER — ACYCLOVIR 400 MG PO TABS: 400.0000 mg | ORAL_TABLET | Freq: Three times a day (TID) | ORAL | 0 refills | Status: DC

## 2018-07-22 NOTE — ED Notes (Signed)
To Rad 

## 2018-07-22 NOTE — Discharge Instructions (Addendum)
Take the prescriptions as directed.  Increase your fluid intake (ie: Gatorade) for the next few days. Call your regular medical doctor tomorrow to schedule a follow up appointment within the next week.  Return to the Emergency Department immediately sooner if worsening.

## 2018-07-22 NOTE — ED Notes (Signed)
Spec to lab

## 2018-07-22 NOTE — ED Notes (Signed)
Patient has called the ED twice in last 2 days and spoken with this nurse regarding her test results.  States she just does not believe they were all negative and wanted to know if there could have been a mixup with her blood.

## 2018-07-22 NOTE — ED Notes (Signed)
Dr Mike Gip in to assess  [place], RN, CN with

## 2018-07-22 NOTE — ED Triage Notes (Signed)
Pt reports that she was seen 07/19/18 and conts to feel bad. Pt conts to have watery discharge and vaginal pain. Reports syncopal episode over the weekend. Nausea and vomiting  States all results were negative . Pt has fever 102 in triage . Pt says she has a dry cough

## 2018-07-22 NOTE — ED Notes (Signed)
Pt reports she was here for suspected STD and or flu  Was worked up and told STD testing was negative Reports she believes she was misdiagnosed as her SO is seeing someone else  Also had a flu test that was negative  Here for further eval

## 2018-07-22 NOTE — ED Provider Notes (Signed)
[facility] EMERGENCY DEPARTMENT Provider Note   CSN: [id number] Arrival date & time: 07/22/18  1746     History   Chief Complaint Chief Complaint  Patient presents with  . multiple complaints    HPI Cathy Dunn is a 38 y.o. female.     Pt was seen at 1825. Per pt, c/o gradual onset and persistence of multiple symptoms for the past week. Pt's symptoms include: subjective fevers/chills, lightheadedness, cough/congestion, upper facial lip and vaginal "blisters." Pt states she is very concerned about her symptoms because she "knows something is wrong," her significant other has "been with others" and may have given her an STD. Pt was evaluated x2 previously in the ED for her symptoms this past week, with reassuring labs check (flu, STD, UA). Pt has also called the ED x2 yesterday and today to review her reassuring testing results. Pt states she "just knows" she has herpes because she "Googled her symptoms." States she came back to the ED to "go over the results again" because they "might have mixed up her blood." Denies sore throat, no vaginal bleeding/discharge, no CP/SOB, no abd pain, no N/V/D, no dysuria/hematuria.     Past Medical History:  Diagnosis Date  . BV (bacterial vaginosis) 01/25/2017  . Depression   . Genital warts 2008  . Heart palpitations   . Scoliosis    childhood    Patient Active Problem List   Diagnosis Date Noted  . Severe episode of recurrent major depressive disorder, without psychotic features (HCC)   . Pregnancy 04/17/2015  . PTSD (post-traumatic stress disorder) 04/17/2015  . Homeless 04/17/2015    Past Surgical History:  Procedure Laterality Date  . SPINAL FUSION    . TUBAL LIGATION  02/2016     OB History    Gravida  4   Para  4   Term      Preterm      AB      Living  4     SAB      TAB      Ectopic      Multiple      Live Births               Home Medications    Prior to Admission medications     Medication Sig Start Date End Date Taking? Authorizing Provider  metroNIDAZOLE (FLAGYL) 500 MG tablet Take 500 mg by mouth 2 (two) times daily.    [provider]    Family History Family History  Problem Relation Age of Onset  . Hypertension Mother   . Hypertension Father   . Heart disease Father     Social History Social History   Tobacco Use  . Smoking status: Never Smoker  . Smokeless tobacco: Never Used  Substance Use Topics  . Alcohol use: No  . Drug use: No     Allergies   Patient has no known allergies.   Review of Systems Review of Systems ROS: Statement: All systems negative except as marked or noted in the HPI; Constitutional: +subjective fever and chills. ; ; Eyes: Negative for eye pain, redness and discharge. ; ; ENMT: Negative for ear pain, hoarseness, nasal congestion, sinus pressure and sore throat. ; ; Cardiovascular: Negative for chest pain, palpitations, diaphoresis, dyspnea and peripheral edema. ; ; Respiratory: +cough. Negative for wheezing and stridor. ; ; Gastrointestinal: Negative for nausea, vomiting, diarrhea, abdominal pain, blood in stool, hematemesis, jaundice and rectal bleeding. . ; ; Genitourinary: Negative  for dysuria, flank pain and hematuria. ; ; GYN:  +genital blisters. No pelvic pain, no vaginal bleeding, no vaginal discharge, no vulvar pain. ;;; Musculoskeletal: Negative for back pain and neck pain. Negative for swelling and trauma.; ; Skin: +blisters. Negative for pruritus, rash, abrasions, bruising and skin lesion.; ; Neuro: +lightheadedness. Negative for headache and neck stiffness. Negative for weakness, altered level of consciousness, altered mental status, extremity weakness, paresthesias, involuntary movement, seizure and syncope.       Physical Exam Updated Vital Signs BP 124/87 (BP Location: Right Arm)   Pulse (!) 116   Temp (!) 102.7 F (39.3 C) (Oral)   Resp 14   Ht 5\' 5"  (1.651 m)   Wt 108.4 kg   LMP 07/06/2018    SpO2 99%   BMI 39.77 kg/m    BP 112/66 (BP Location: Right Arm)   Pulse 91   Temp 98.3 F (36.8 C) (Oral)   Resp 16   Ht 5\' 5"  (1.651 m)   Wt 108.4 kg   LMP 07/06/2018   SpO2 98%   BMI 39.77 kg/m    19:27 Orthostatic Vital Signs TH  Orthostatic Lying   BP- Lying: 112/74  Pulse- Lying: 114      Orthostatic Sitting  BP- Sitting: 112/69  Pulse- Sitting: 116      Orthostatic Standing at 0 minutes  BP- Standing at 0 minutes: 102/74  Pulse- Standing at 0 minutes: 118     Physical Exam 1830: Physical examination:  Nursing notes reviewed; Vital signs and O2 SAT reviewed;  Constitutional: Well developed, Well nourished, Well hydrated, In no acute distress; Head:  Normocephalic, atraumatic; Eyes: EOMI, PERRL, No scleral icterus; ENMT: TM's clear bilat. +edemetous nasal turbinates bilat with clear rhinorrhea. +right upper lip area with small blister. Mouth and pharynx without lesions. No tonsillar exudates. No intra-oral edema. No submandibular or sublingual edema. No hoarse voice, no drooling, no stridor. No trismus.  Mouth and pharynx normal, Mucous membranes moist; Neck: Supple, Full range of motion, No lymphadenopathy; Cardiovascular: Regular rate and rhythm, No gallop; Respiratory: Breath sounds clear & equal bilaterally, No wheezes.  Speaking full sentences with ease, Normal respiratory effort/excursion; Chest: Nontender, Movement normal; Abdomen: Soft, Nontender, Nondistended, Normal bowel sounds; Genitourinary: No CVA tenderness; Extremities: Peripheral pulses normal, No tenderness, No edema, No calf edema or asymmetry.; Neuro: AA&Ox3, Major CN grossly intact.  Speech clear. No gross focal motor or sensory deficits in extremities.; Skin: Color normal, Warm, Dry.; Psych:  Anxious.     ED Treatments / Results  Labs (all labs ordered are listed, but only abnormal results are displayed)   EKG None  Radiology   Procedures Procedures (including critical care  time)  Medications Ordered in ED Medications  sodium chloride 0.9 % bolus 1,000 mL (has no administration in time range)  acetaminophen (TYLENOL) tablet 650 mg (650 mg Oral Given 07/22/18 1843)  ibuprofen (ADVIL,MOTRIN) tablet 400 mg (400 mg Oral Given 07/22/18 1843)  acyclovir (ZOVIRAX) tablet 400 mg (400 mg Oral Given 07/22/18 1843)     Initial Impression / Assessment and Plan / ED Course  I have reviewed the triage vital signs and the nursing notes.  Pertinent labs & imaging results that were available during my care of the patient were reviewed by me and considered in my medical decision making (see chart for details).  MDM Reviewed: previous chart, nursing note and vitals Reviewed previous: labs Interpretation: labs and x-ray   Results for orders placed or performed during the hospital  encounter of 07/22/18  Urinalysis, Routine w reflex microscopic  Result Value Ref Range   Color, Urine AMBER (A) YELLOW   APPearance HAZY (A) CLEAR   Specific Gravity, Urine 1.028 1.005 - 1.030   pH 5.0 5.0 - 8.0   Glucose, UA NEGATIVE NEGATIVE mg/dL   Hgb urine dipstick SMALL (A) NEGATIVE   Bilirubin Urine NEGATIVE NEGATIVE   Ketones, ur 5 (A) NEGATIVE mg/dL   Protein, ur 026 (A) NEGATIVE mg/dL   Nitrite NEGATIVE NEGATIVE   Leukocytes, UA MODERATE (A) NEGATIVE   RBC / HPF 21-50 0 - 5 RBC/hpf   WBC, UA >50 (H) 0 - 5 WBC/hpf   Bacteria, UA NONE SEEN NONE SEEN   Squamous Epithelial / LPF 0-5 0 - 5   Mucus PRESENT   Pregnancy, urine  Result Value Ref Range   Preg Test, Ur NEGATIVE NEGATIVE   Dg Chest 2 View Result Date: 07/22/2018 CLINICAL DATA:  Syncope with nausea vomiting. EXAM: CHEST - 2 VIEW COMPARISON:  03/01/2018 FINDINGS: The lungs are clear without focal pneumonia, edema, pneumothorax or pleural effusion. The cardiopericardial silhouette is within normal limits for size. Extensive thoracolumbar fusion hardware. IMPRESSION: Stable.  No acute findings. Electronically Signed   By:  Kennith Center M.D.   On: 07/22/2018 20:31     1835:  Long d/w pt with myself and Charge RN who has spoken to pt on phone x2 regarding her HPI/testing results. Pt very anxious regarding STD. Previous ED Provider noted blisters on vaginal area; will tx with acyclovir PO. Pt feels reassured regarding this. Also offered pt IVF, CXR and repeat UA. Pt agreeable and now calmer.   2130:  Pt unsure if she is having dysuria; will tx with keflex. Will also rx acyclovir for vaginal lesions. IVF bolus given for orthostatic hypotension with improvement. APAP and motrin given for fever with improvement. Pt states she "feels better now" and is ready to go home. Dx and testing d/w pt.  Questions answered.  Verb understanding, agreeable to d/c home with outpt f/u.     Final Clinical Impressions(s) / ED Diagnoses   Final diagnoses:  None    ED Discharge Orders    None       Samuel Jester, DO 07/26/18 1331

## 2020-06-30 IMAGING — DX DG CHEST 2V
2 series · 2 of 2 positions shown · non-contrast
Comparison: 03/01/2018

CLINICAL DATA: Syncope with nausea vomiting.

EXAM:
CHEST - 2 VIEW

[chest pa]
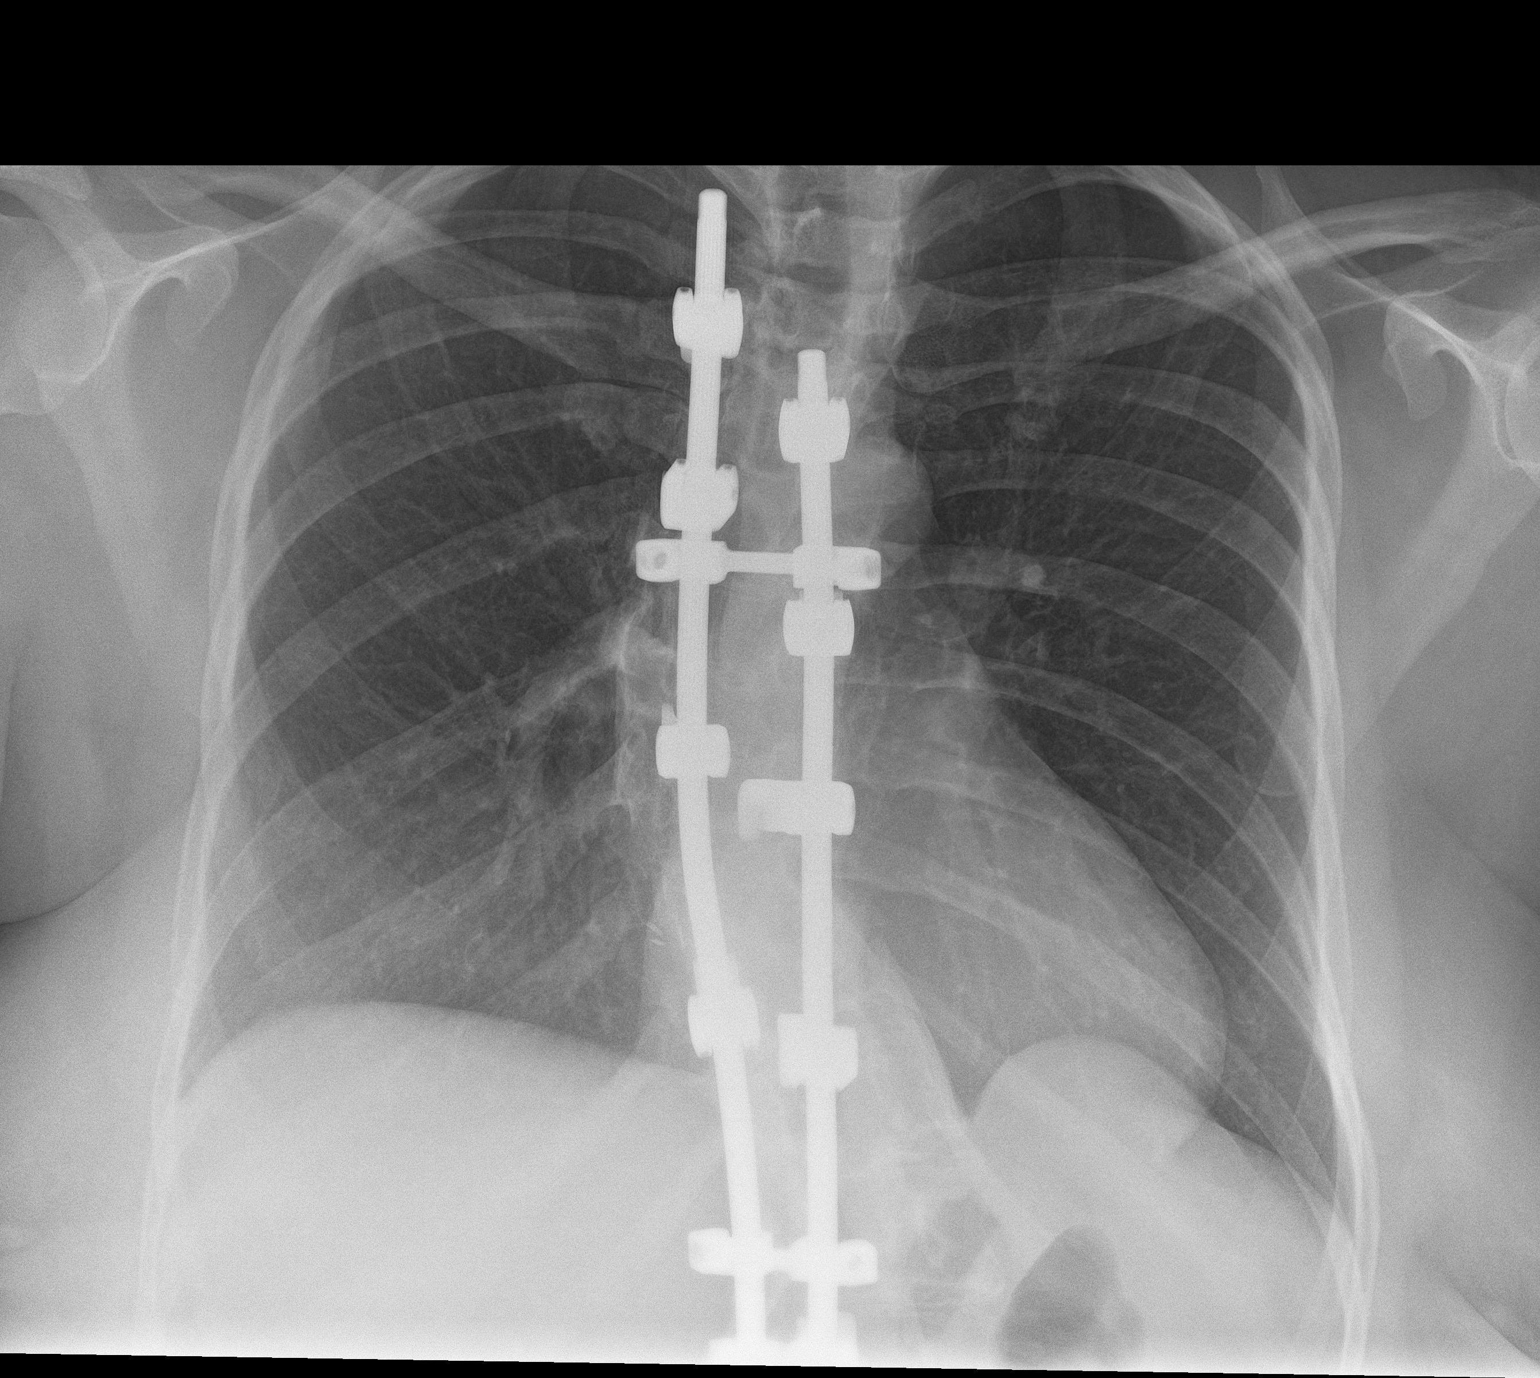

[chest lat]
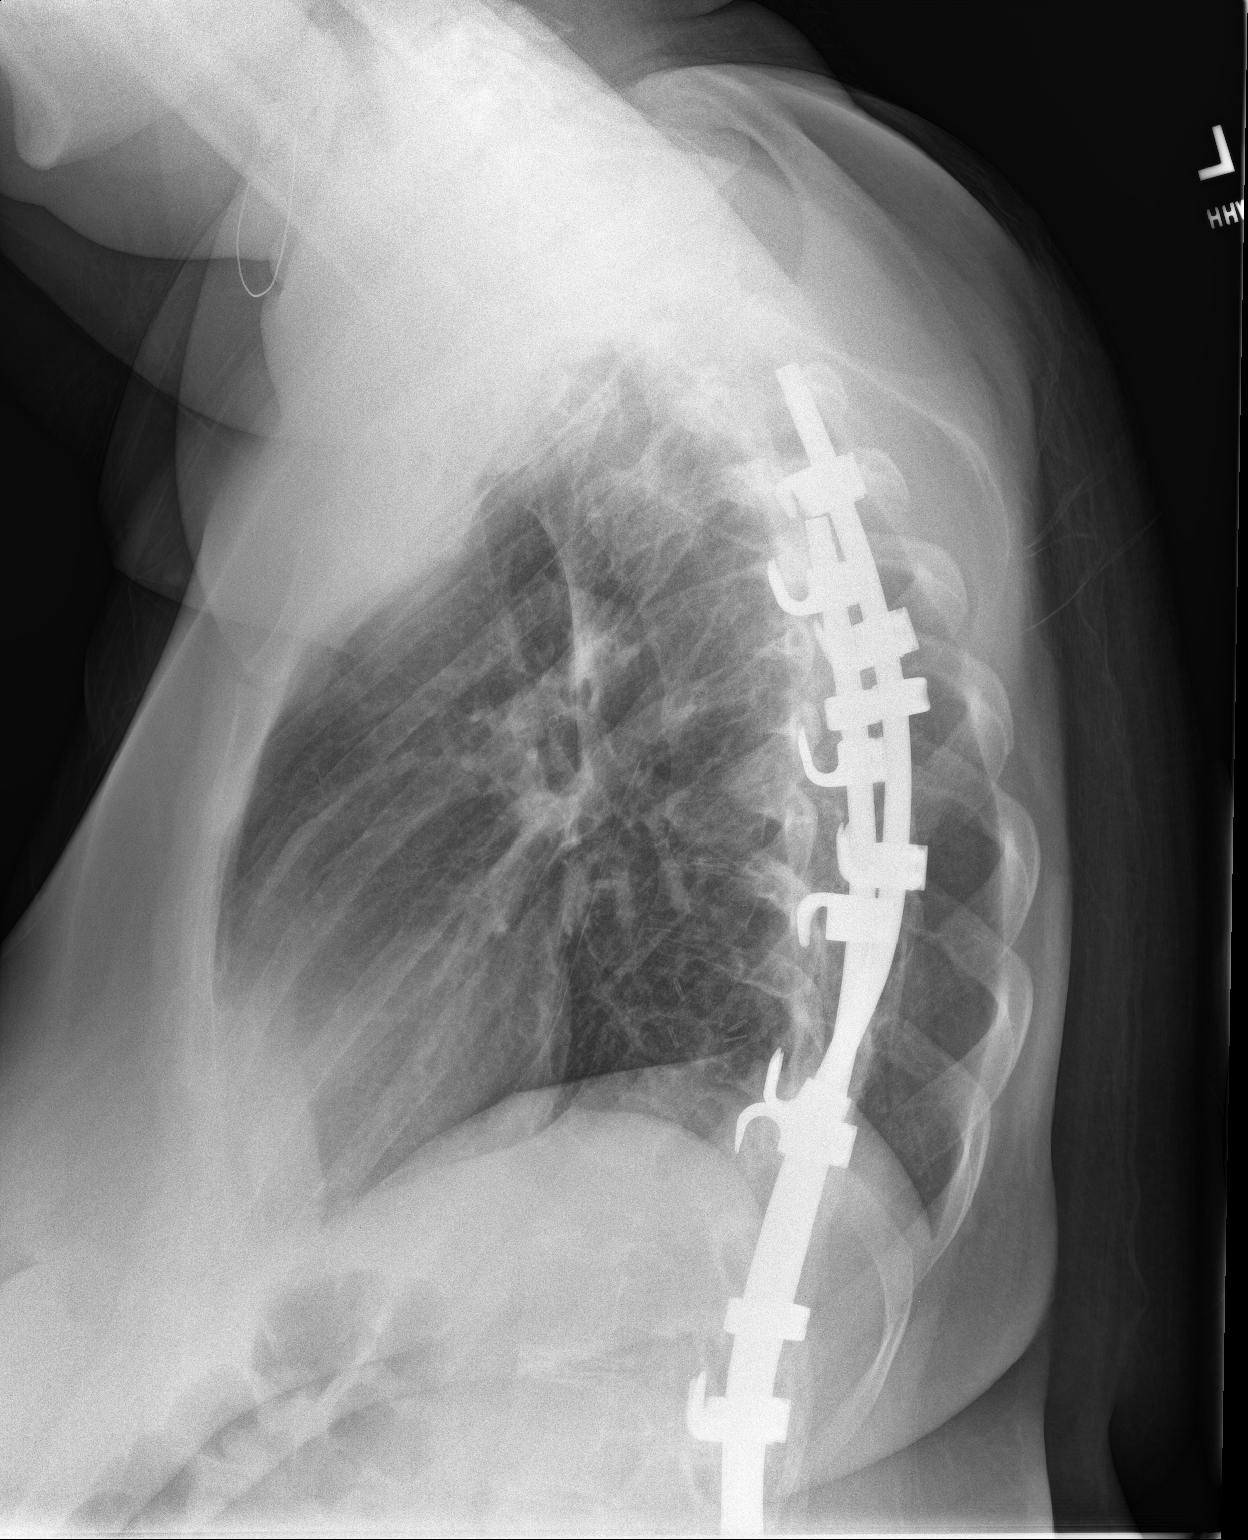

[2 of 2 positions shown; findings below may reference images not displayed]

FINDINGS: The lungs are clear without focal pneumonia, edema, pneumothorax or
pleural effusion. The cardiopericardial silhouette is within normal
limits for size. Extensive thoracolumbar fusion hardware.
IMPRESSION: Stable.  No acute findings.

## 2021-09-09 ENCOUNTER — Encounter (INDEPENDENT_AMBULATORY_CARE_PROVIDER_SITE_OTHER): Payer: Self-pay | Admitting: Podiatry

## 2021-09-09 ENCOUNTER — Ambulatory Visit: Payer: Medicaid Other

## 2021-09-09 DIAGNOSIS — M2010 Hallux valgus (acquired), unspecified foot: Secondary | ICD-10-CM

## 2021-09-09 NOTE — Progress Notes (Signed)
NO SHOW. This encounter was created in error - please disregard.

## 2021-11-24 ENCOUNTER — Ambulatory Visit (INDEPENDENT_AMBULATORY_CARE_PROVIDER_SITE_OTHER): Payer: Medicaid Other

## 2021-11-24 ENCOUNTER — Encounter: Payer: Self-pay | Admitting: Emergency Medicine

## 2021-11-24 ENCOUNTER — Ambulatory Visit
Admission: EM | Admit: 2021-11-24 | Discharge: 2021-11-24 | Disposition: A | Discharge status: Home / Self Care | Payer: Medicaid Other | Attending: Nurse Practitioner | Admitting: Nurse Practitioner

## 2021-11-24 DIAGNOSIS — M79672 Pain in left foot: Secondary | ICD-10-CM

## 2021-11-24 MED ORDER — IBUPROFEN 800 MG PO TABS
800.0000 mg | ORAL_TABLET | Freq: Three times a day (TID) | ORAL | 0 refills | Status: AC | PRN
Start: 2021-11-24 — End: ?

## 2021-11-24 NOTE — ED Triage Notes (Signed)
Twisted left foot 2 days ago.  Pain and hurts to move toes. ?

## 2021-11-24 NOTE — Discharge Instructions (Addendum)
Your xray is negative for fracture or dislocation.  ?Take medication as prescribed. ?Use the ACE wrap with prolonged activity such as walking or standing.  ?Wear shoes with good support and insoles. ?Apply ice to the affected area, apply for 20 minutes, remove for 1 hour then repeat. ?If symptoms do not improve, recommend follow-up with [facility] [place] [phone] or [facility] in [place] [phone]. ?Follow up as needed.  ? ?

## 2021-11-24 NOTE — ED Provider Notes (Signed)
?[facility] ? ? ? ?CSN: [id number] ?Arrival date & time: 11/24/21  1210 ? ? ?  ? ?History   ?Chief Complaint ?No chief complaint on file. ? ? ?HPI ?Cathy Dunn is a 41 y.o. female.  ? ?The patient is a 41 year old female who presents with pain in the left foot.  Patient states 2 days ago she was standing on her back porch and as she was turning her body, she felt pain in the top of her left foot.  Since that time she has pain that radiates into her toes, and has pain with ambulation.  She denies any other previous injury.  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  She denies any numbness, tingling, or radiation of pain into the ankle.  States that she took a BC powder for her symptoms. ? ?The history is provided by the patient.  ? ?Past Medical History:  ?Diagnosis Date  ? BV (bacterial vaginosis) 01/25/2017  ? Depression   ? Genital warts 2008  ? Heart palpitations   ? Scoliosis   ? childhood  ? ? ?Patient Active Problem List  ? Diagnosis Date Noted  ? Reaction to severe stress, unspecified 11/23/2017  ? Severe episode of recurrent major depressive disorder, without psychotic features (HCC)   ? Pregnancy 04/17/2015  ? PTSD (post-traumatic stress disorder) 04/17/2015  ? Homeless 04/17/2015  ? ? ?Past Surgical History:  ?Procedure Laterality Date  ? SPINAL FUSION    ? TUBAL LIGATION  02/2016  ? ? ?OB History   ? ? Gravida  ?4  ? Para  ?4  ? Term  ?   ? Preterm  ?   ? AB  ?   ? Living  ?4  ?  ? ? SAB  ?   ? IAB  ?   ? Ectopic  ?   ? Multiple  ?   ? Live Births  ?   ?   ?  ?  ? ? ? ?Home Medications   ? ?Prior to Admission medications   ?Medication Sig Start Date End Date Taking? Authorizing Provider  ?ibuprofen (ADVIL) 800 MG tablet Take 1 tablet (800 mg total) by mouth every 8 (eight) hours as needed. 11/24/21  Yes Bray Vickerman-Warren, Sadie Haberhristie J, NP  ?valACYclovir (VALTREX) 500 MG tablet Take 500 mg by mouth daily. 09/08/21   [provider]  ? ? ?Family History ?Family History  ?Problem Relation Age of Onset  ? Hypertension Mother   ? Hypertension Father   ? Heart disease Father   ? ? ?Social History ?Social History  ? ?Tobacco Use  ? Smoking status: Never  ? Smokeless tobacco: Never  ?Vaping Use  ? Vaping Use: Never used  ?Substance Use Topics  ? Alcohol use: No  ? Drug use: No  ? ? ? ?Allergies   ?Patient has no known allergies. ? ? ?Review of Systems ?Review of Systems  ?Constitutional: Negative.   ?Musculoskeletal:   ?     Left foot pain  ?Skin: Negative.   ?Psychiatric/Behavioral: Negative.    ? ? ?Physical Exam ?Triage Vital Signs ?ED Triage Vitals  ?Enc Vitals Group  ?   BP 11/24/21 1227 118/75  ?   Pulse Rate 11/24/21 1227 83  ?   Resp 11/24/21 1227 18  ?   Temp 11/24/21 1227 98 ?F (36.7 ?C)  ?   Temp Source 11/24/21 1227  Oral  ?   SpO2 11/24/21 1227 98 %  ?   Weight --   ?   Height --   ?   Head Circumference --   ?   Peak Flow --   ?   Pain Score 11/24/21 1228 8  ?   Pain Loc --   ?   Pain Edu? --   ?   Excl. in GC? --   ? ?No data found. ? ?Updated Vital Signs ?BP 118/75 (BP Location: Right Arm)   Pulse 83   Temp 98 ?F (36.7 ?C) (Oral)   Resp 18   LMP 11/05/2021 (Exact Date)   SpO2 98%  ? ?Visual Acuity ?Right Eye Distance:   ?Left Eye Distance:   ?Bilateral Distance:   ? ?Right Eye Near:   ?Left Eye Near:    ?Bilateral Near:    ? ?Physical Exam ?Vitals and nursing note reviewed.  ?Constitutional:   ?   Appearance: Normal appearance.  ?Musculoskeletal:  ?   Left foot: Decreased range of motion. Normal capillary refill. Tenderness present. No swelling. Normal pulse.  ?   Comments: Tenderness to the dorsal aspect of the left foot. No swelling, deformity noted.   ?Skin: ?   General: Skin is warm and dry.  ?   Capillary Refill: Capillary refill takes less than 2 seconds.  ?Neurological:  ?   General: No focal deficit present.  ?   Mental Status: She is alert and oriented to person, place, and time.  ?Psychiatric:     ?   Mood  and Affect: Mood normal.     ?   Behavior: Behavior normal.  ? ? ? ?UC Treatments / Results  ?Labs ?(all labs ordered are listed, but only abnormal results are displayed) ?Labs Reviewed - No data to display ? ?EKG ? ? ?Radiology ?DG Foot Complete Left ? ?Result Date: 11/24/2021 ?CLINICAL DATA:  Twisted foot 2 days ago with pain. EXAM: LEFT FOOT - COMPLETE 3+ VIEW COMPARISON:  None Available. FINDINGS: There is no evidence of fracture or dislocation. A plantar calcaneal enthesophyte is noted. Soft tissues are unremarkable. IMPRESSION: No acute osseous injury. Electronically Signed   By: Romona Curls M.D.   On: 11/24/2021 12:41   ? ?Procedures ?Procedures (including critical care time) ? ?Medications Ordered in UC ?Medications - No data to display ? ?Initial Impression / Assessment and Plan / UC Course  ?I have reviewed the triage vital signs and the nursing notes. ? ?Pertinent labs & imaging results that were available during my care of the patient were reviewed by me and considered in my medical decision making (see chart for details). ? ?The patient is a 41 year old female who presents with left foot pain.  Patient states symptoms started 2 days ago when she was turning and felt pain in her foot.  She states since that time she has had pain in the top of the foot that radiates into her toes.  She also has pain with walking.  Her exam does not reveal any abnormality that would suggest fracture or dislocation.  Her x-rays are also confirmatory of the same.  Patient advised that it is difficult to ascertain the cause of her foot pain as she may need further imaging.  Patient advised to try RICE therapy, an Ace wrap was provided today along with ibuprofen to see if this helps with her pain.  Also recommended wearing shoes with good insoles and support to see if this helps with her  pain.  Patient was given the information for Ortho care [place] and [facility] if her symptoms do not improve. ?Final Clinical  Impressions(s) / UC Diagnoses  ? ?Final diagnoses:  ?Left foot pain  ? ? ? ?Discharge Instructions   ? ?  ?Your xray is negative for fracture or dislocation.  ?Take medication as prescribed. ?Use the ACE wrap with prolonged activity such as walking or standing.  ?Wear shoes with good support and insoles. ?Apply ice to the affected area, apply for 20 minutes, remove for 1 hour then repeat. ?If symptoms do not improve, recommend follow-up with [facility] at [place] [phone] or [facility] in [place] [phone]. ?Follow up as needed.  ? ? ? ? ? ?ED Prescriptions   ? ? Medication Sig Dispense Auth. Provider  ? ibuprofen (ADVIL) 800 MG tablet Take 1 tablet (800 mg total) by mouth every 8 (eight) hours as needed. 20 tablet Kele Withem-Warren, Sadie Haber, NP  ? ?  ? ?PDMP not reviewed this encounter. ?  ?Abran Cantor, NP ?11/24/21 1406 ? ?

## 2023-03-22 ENCOUNTER — Encounter: Payer: Self-pay | Admitting: Orthopaedic Surgery

## 2023-03-22 ENCOUNTER — Ambulatory Visit (INDEPENDENT_AMBULATORY_CARE_PROVIDER_SITE_OTHER): Payer: Medicaid Other | Admitting: Orthopaedic Surgery

## 2023-03-22 VITALS — Ht 65.0 in | Wt 236.4 lb

## 2023-03-22 DIAGNOSIS — M5442 Lumbago with sciatica, left side: Secondary | ICD-10-CM

## 2023-03-23 NOTE — Progress Notes (Signed)
Office Visit Note   Patient: Cathy Dunn           Date of Birth: 1980-10-28           MRN: [id number] Visit Date: 03/22/2023              Requested by: Randell Patient [facility] [address] [place],  [place] [address] PCP: Health, [facility] Public   Assessment & Plan: Visit Diagnoses:  1. Midline low back pain with left-sided sciatica, unspecified chronicity     Plan: Will refer patient to head hospital for core strengthening post instrumented scoliosis fusion.  On return Symptoms will obtain AP lateral scoliosis film since all previous images obtained at [facility] are unable to be visualized.  Follow-Up Instructions: Return in about 7 weeks (around 05/10/2023).   Orders:  Orders Placed This Encounter  Procedures   Ambulatory referral to Physical Therapy   No orders of the defined types were placed in this encounter.     Procedures: No procedures performed   Clinical Data: No additional findings.   Subjective: Chief Complaint  Patient presents with   Left Foot - Pain    Pain, thinks it is coming from her back. After sitting for awhile, get up to walk and it feels like there is something hard in the heel area.   Right Foot - Pain    HPI 42 year old female states she thought she was here for back pain with.  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  She taken muscle relaxants in the past as well as over-the-counter medication not on any narcotics.  Discomfort sometimes is burning.  She is also noted problems with bunions bilaterally worse on the left foot than right foot.  Previous x-rays 11/24/2021 showed the exostosis first metatarsal.  Certain shoes bother her.  No bowel bladder symptoms.  Review of Systems all systems noncontributory  to HPI.   Objective: Vital Signs: Ht 5\' 5"  (1.651 m)   Wt 236 lb 6 oz (107.2 kg)   BMI 39.33 kg/m   Physical Exam Constitutional:      Appearance: She is well-developed.  HENT:     Head: Normocephalic.     Right Ear: External ear normal.     Left Ear: External ear normal. There is no impacted cerumen.  Eyes:     Pupils: Pupils are equal, round, and reactive to light.  Neck:     Thyroid: No thyromegaly.     Trachea: No tracheal deviation.  Cardiovascular:     Rate and Rhythm: Normal rate.  Pulmonary:     Effort: Pulmonary effort is normal.  Abdominal:     Palpations: Abdomen is soft.  Musculoskeletal:     Cervical back: No rigidity.  Skin:    General: Skin is warm and dry.  Neurological:     Mental Status: She is alert and oriented to person, place, and time.  Psychiatric:        Behavior: Behavior normal.     Ortho Exam mild bunion deformity left foot with minimal callus formation.  She has good flexion of the hips well-healed lumbar incision from scoliosis rod fusion.  Specialty Comments:  pression  1. Left convex scoliosis with Harrington rods in place. 2. Mild  spondylosis at the thoracolumbar junction, with prominent facet hypertrophic changes in the lower lumbar spine. 3. No acute bony abnormality.   Electronically Signed   By: Sharlet Salina M.D.   On: 07/05/2022 20:57 Narrative   Imaging: No results found.   PMFS History: Patient Active Problem List   Diagnosis Date Noted   Reaction to severe stress, unspecified 11/23/2017   Severe episode of recurrent major depressive disorder, without psychotic features (HCC)    Pregnancy 04/17/2015   PTSD (post-traumatic stress disorder) 04/17/2015   Homeless 04/17/2015   Past Medical History:  Diagnosis Date   BV (bacterial vaginosis) 01/25/2017   Depression    Genital warts 2008   Heart palpitations    Scoliosis    childhood    Family History  Problem Relation Age of Onset   Hypertension Mother     Hypertension Father    Heart disease Father     Past Surgical History:  Procedure Laterality Date   SPINAL FUSION     TUBAL LIGATION  02/2016   Social History   Occupational History   Not on file  Tobacco Use   Smoking status: Never   Smokeless tobacco: Never  Vaping Use   Vaping status: Never Used  Substance and Sexual Activity   Alcohol use: No   Drug use: No   Sexual activity: Not on file

## 2023-05-03 ENCOUNTER — Ambulatory Visit: Payer: Medicaid Other | Admitting: Orthopaedic Surgery

## 2023-10-04 ENCOUNTER — Ambulatory Visit (INDEPENDENT_AMBULATORY_CARE_PROVIDER_SITE_OTHER): Admitting: Orthopaedic Surgery

## 2023-10-04 ENCOUNTER — Other Ambulatory Visit (INDEPENDENT_AMBULATORY_CARE_PROVIDER_SITE_OTHER): Payer: Self-pay

## 2023-10-04 DIAGNOSIS — M5442 Lumbago with sciatica, left side: Secondary | ICD-10-CM | POA: Diagnosis not present

## 2023-10-04 NOTE — Progress Notes (Signed)
 Office Visit Note   Patient: Cathy Dunn           Date of Birth: [birth year]           MRN: [id number] Visit Date: 10/04/2023              Requested by: Randell Patient [facility] [address] [place],  [place] [address] PCP: Health, [facility] Public   Assessment & Plan: Visit Diagnoses:  1. Midline low back pain with left-sided sciatica, unspecified chronicity     Plan: Will place therapy referral for home exercise program for core strengthening.  She can continue to walk in the park she has done 3 miles yesterday will continue to work on gradual weight loss to unload her back.  We discussed work activities would be best where she is doing some sitting some standing moving around not having to do heavy lifting repetitive bending or stooping but could do it occasionally.  No diagnostic imaging needed at this point.  Follow-Up Instructions: No follow-ups on file.   Orders:  Orders Placed This Encounter  Procedures   XR SCOLIOSIS EVAL COMPLETE SPINE 2 OR 3 VIEWS   No orders of the defined types were placed in this encounter.     Procedures: No procedures performed   Clinical Data: No additional findings.   Subjective: Chief Complaint  Patient presents with   Scoliosis    Follow up    HPI 43 year old female returns with ongoing problems with thoracolumbar discomfort as well as pain that she gets in her hips sometimes numbness in her thighs.  She had a job at [employer] but stopped after a couple months.  She has lost a few pounds on the keto diet recently she is back at home has 4 children no toddlers most teenagers.  She denies bowel bladder associated symptoms.  Fusion was at age 21 in 79.  She does better changing positions.  Denies myelopathic symptoms no claudication symptoms.  We ordered physical therapy but patient never went.  She states she is now interested in going to therapy.  Review of Systems all systems updated  unchanged.   Objective: Vital Signs: There were no vitals taken for this visit.  Physical Exam Constitutional:      Appearance: She is well-developed.  HENT:     Head: Normocephalic.     Right Ear: External ear normal.     Left Ear: External ear normal. There is no impacted cerumen.  Eyes:     Pupils: Pupils are equal, round, and reactive to light.  Neck:     Thyroid: No thyromegaly.     Trachea: No tracheal deviation.  Cardiovascular:     Rate and Rhythm: Normal rate.  Pulmonary:     Effort: Pulmonary effort is normal.  Abdominal:     Palpations: Abdomen is soft.  Musculoskeletal:     Cervical back: No rigidity.  Skin:    General: Skin is warm and dry.  Neurological:     Mental Status: She is alert and oriented to person, place, and time.  Psychiatric:        Behavior: Behavior normal.     Ortho Exam normal heel-toe gait mild pelvic obliquity with right side pelvis higher.  Right thoracic left lumbar incision with fusion.  No rod prominence.  Specialty Comments:  No specialty comments available.  Imaging: No results found.   PMFS History: Patient Active Problem List   Diagnosis Date Noted   Reaction to severe  stress, unspecified 11/23/2017   Severe episode of recurrent major depressive disorder, without psychotic features (HCC)    Pregnancy 04/17/2015   PTSD (post-traumatic stress disorder) 04/17/2015   Homeless 04/17/2015   Past Medical History:  Diagnosis Date   BV (bacterial vaginosis) 01/25/2017   Depression    Genital warts 2008   Heart palpitations    Scoliosis    childhood    Family History  Problem Relation Age of Onset   Hypertension Mother    Hypertension Father    Heart disease Father     Past Surgical History:  Procedure Laterality Date   SPINAL FUSION     TUBAL LIGATION  02/2016   Social History   Occupational History   Not on file  Tobacco Use   Smoking status: Never   Smokeless tobacco: Never  Vaping Use   Vaping status:  Never Used  Substance and Sexual Activity   Alcohol use: No   Drug use: No   Sexual activity: Not on file

## 2023-10-24 ENCOUNTER — Encounter (INDEPENDENT_AMBULATORY_CARE_PROVIDER_SITE_OTHER): Payer: Self-pay

## 2023-11-01 ENCOUNTER — Ambulatory Visit: Admitting: Nurse Practitioner

## 2023-11-01 VITALS — BP 126/79 | HR 80 | Temp 98.0°F | Ht 65.0 in | Wt 240.0 lb

## 2023-11-01 DIAGNOSIS — Z6839 Body mass index (BMI) 39.0-39.9, adult: Secondary | ICD-10-CM

## 2023-11-01 DIAGNOSIS — E66812 Obesity, class 2: Secondary | ICD-10-CM | POA: Diagnosis not present

## 2023-11-01 DIAGNOSIS — D649 Anemia, unspecified: Secondary | ICD-10-CM | POA: Diagnosis not present

## 2023-11-01 NOTE — Progress Notes (Signed)
 Office: [phone]  /  Fax: [phone]   Initial Visit  Cathy Dunn was seen in clinic today to evaluate for obesity. She is interested in losing weight to improve overall health and reduce the risk of weight related complications. She presents today to review program treatment options, initial physical assessment, and evaluation.     She was referred by: PCP  When asked what else they would like to accomplish? She states: Adopt healthier eating patterns, Improve energy levels and physical activity, Improve quality of life, Improve appearance, and Improve self-confidence   When asked how has your weight affected you? She states: Contributed to orthopedic problems or mobility issues, Having fatigue, and Having poor endurance  Some associated conditions: anemia (taking iron), back pain (seeing ortho), depression  Contributing factors: Family history of obesity, Use of obesogenic medications: Contraceptives or hormonal therapy, and Moderate to high levels of stress  Weight promoting medications identified: Contraceptives or hormonal therapy  Current nutrition plan: Keto  Current level of physical activity: None  Current or previous pharmacotherapy: None  Response to medication: Never tried medications   Past medical history includes:   Past Medical History:  Diagnosis Date   BV (bacterial vaginosis) 01/25/2017   Depression    Genital warts 2008   Heart palpitations    Scoliosis    childhood     Objective:   BP 126/79   Pulse 80   Temp 98 F (36.7 C)   Ht 5\' 5"  (1.651 m)   Wt 240 lb (108.9 kg)   LMP 10/30/2023 (Exact Date)   SpO2 98%   BMI 39.94 kg/m  She was weighed on the bioimpedance scale: Body mass index is 39.94 kg/m.  Peak Weight: 246 lbs , Body Fat%:45.7%, Visceral Fat Rating:12, Weight trend over the last 12 months: Increasing  General:  Alert, oriented and cooperative. Patient is in no acute distress.  Respiratory: Normal respiratory effort,  no problems with respiration noted   Gait: able to ambulate independently  Mental Status: Normal mood and affect. Normal behavior. Normal judgment and thought content.   DIAGNOSTIC DATA REVIEWED:  BMET    Component Value Date/Time   NA 138 06/28/2018 1258   K 3.5 06/28/2018 1258   CL 108 06/28/2018 1258   CO2 24 06/28/2018 1258   GLUCOSE 96 06/28/2018 1258   BUN 13 06/28/2018 1258   CREATININE 0.67 06/28/2018 1258   CALCIUM 9.0 06/28/2018 1258   GFRNONAA >60 06/28/2018 1258   GFRAA >60 06/28/2018 1258   No results found for: "HGBA1C" No results found for: "INSULIN" CBC    Component Value Date/Time   WBC 4.6 06/28/2018 1258   RBC 4.10 06/28/2018 1258   HGB 9.8 (L) 06/28/2018 1258   HCT 32.9 (L) 06/28/2018 1258   PLT 429 (H) 06/28/2018 1258   MCV 80.2 06/28/2018 1258   MCH 23.9 (L) 06/28/2018 1258   MCHC 29.8 (L) 06/28/2018 1258   RDW 15.8 (H) 06/28/2018 1258   Iron/TIBC/Ferritin/ %Sat No results found for: "IRON", "TIBC", "FERRITIN", "IRONPCTSAT" Lipid Panel  No results found for: "CHOL", "TRIG", "HDL", "CHOLHDL", "VLDL", "LDLCALC", "LDLDIRECT" Hepatic Function Panel  No results found for: "PROT", "ALBUMIN", "AST", "ALT", "ALKPHOS", "BILITOT", "BILIDIR", "IBILI" No results found for: "TSH"   Assessment and Plan:   Anemia, unspecified type Will obtain labs at next visit  Class 2 obesity due to excess calories without serious comorbidity with body mass index (BMI) of 39.0 to 39.9 in adult  Obesity Treatment / Action Plan:  Patient will work on garnering support from family and friends to begin weight loss journey. Will work on eliminating or reducing the presence of highly palatable, calorie dense foods in the home. Will complete provided nutritional and psychosocial assessment questionnaire before the next appointment. Will be scheduled for indirect calorimetry to determine resting energy expenditure in a fasting state.  This will allow us  to create a  reduced calorie, high-protein meal plan to promote loss of fat mass while preserving muscle mass. Counseled on the health benefits of losing 5%-15% of total body weight. Was counseled on nutritional approaches to weight loss and benefits of reducing processed foods and consuming plant-based foods and high quality protein as part of nutritional weight management. Was counseled on pharmacotherapy and role as an adjunct in weight management.   Obesity Education Performed Today:  She was weighed on the bioimpedance scale and results were discussed and documented in the synopsis.  We discussed obesity as a disease and the importance of a more detailed evaluation of all the factors contributing to the disease.  We discussed the importance of long term lifestyle changes which include nutrition, exercise and behavioral modifications as well as the importance of customizing this to her specific health and social needs.  We discussed the benefits of reaching a healthier weight to alleviate the symptoms of existing conditions and reduce the risks of the biomechanical, metabolic and psychological effects of obesity.  Cathy Dunn appears to be in the action stage of change and states they are ready to start intensive lifestyle modifications and behavioral modifications.  30 minutes was spent today on this visit including the above counseling, pre-visit chart review, and post-visit documentation.  Reviewed by clinician on day of visit: allergies, medications, problem list, medical history, surgical history, family history, social history, and previous encounter notes pertinent to obesity diagnosis.    Crist Dominion Corvette Orser FNP-C

## 2023-11-03 IMAGING — DX DG FOOT COMPLETE 3+V*L*
3 series · 3 of 3 positions shown · non-contrast
Comparison: None Available.

CLINICAL DATA: Twisted foot 2 days ago with pain.

EXAM:
LEFT FOOT - COMPLETE 3+ VIEW

[foot ap]
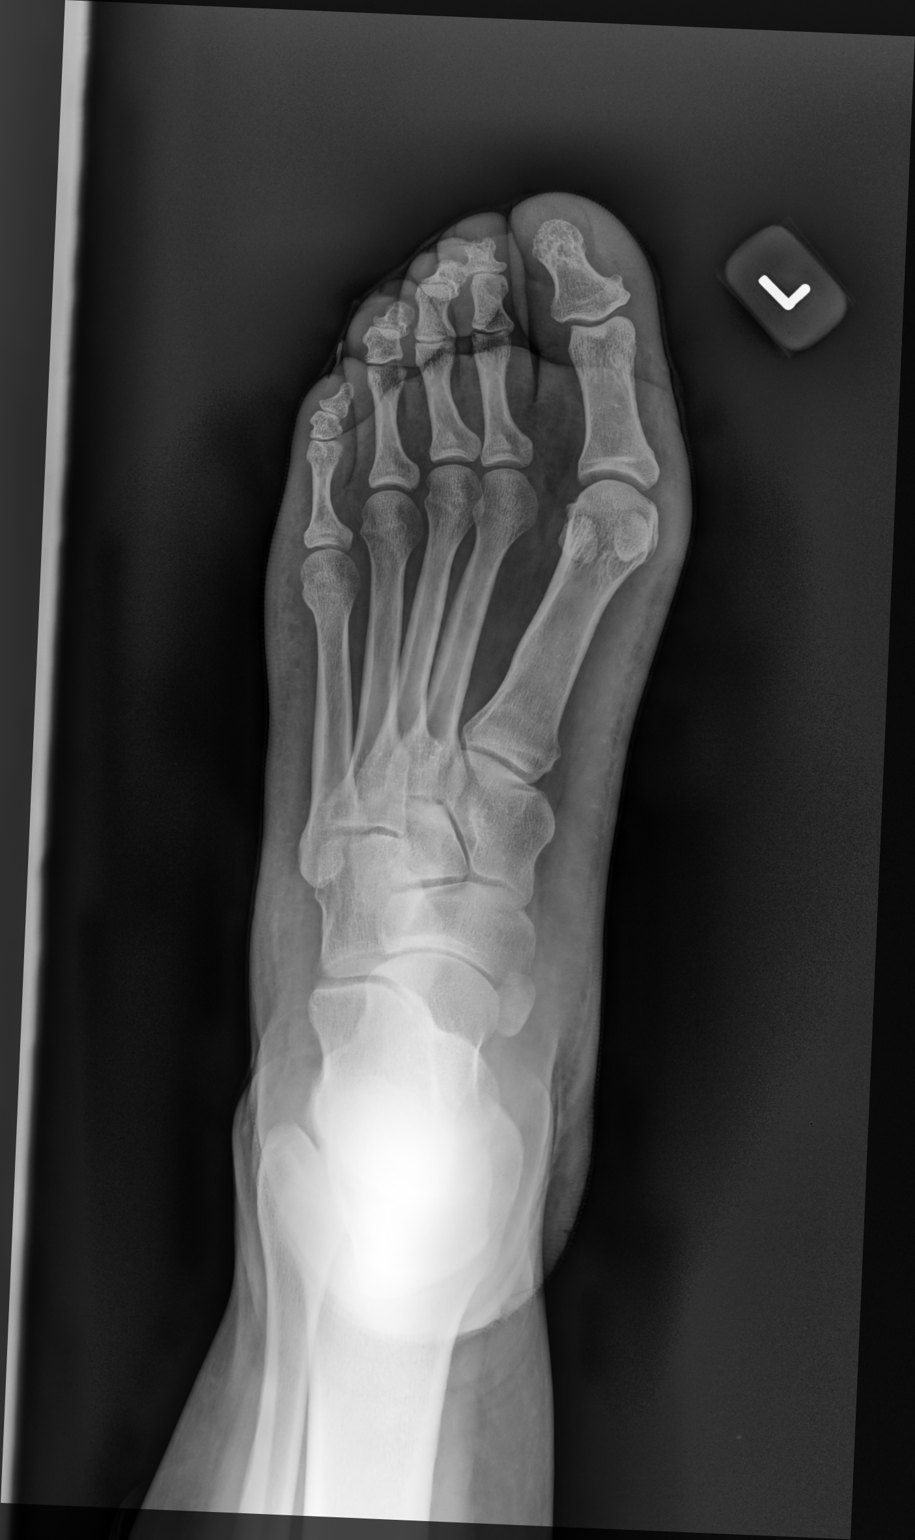

[foot mlo]
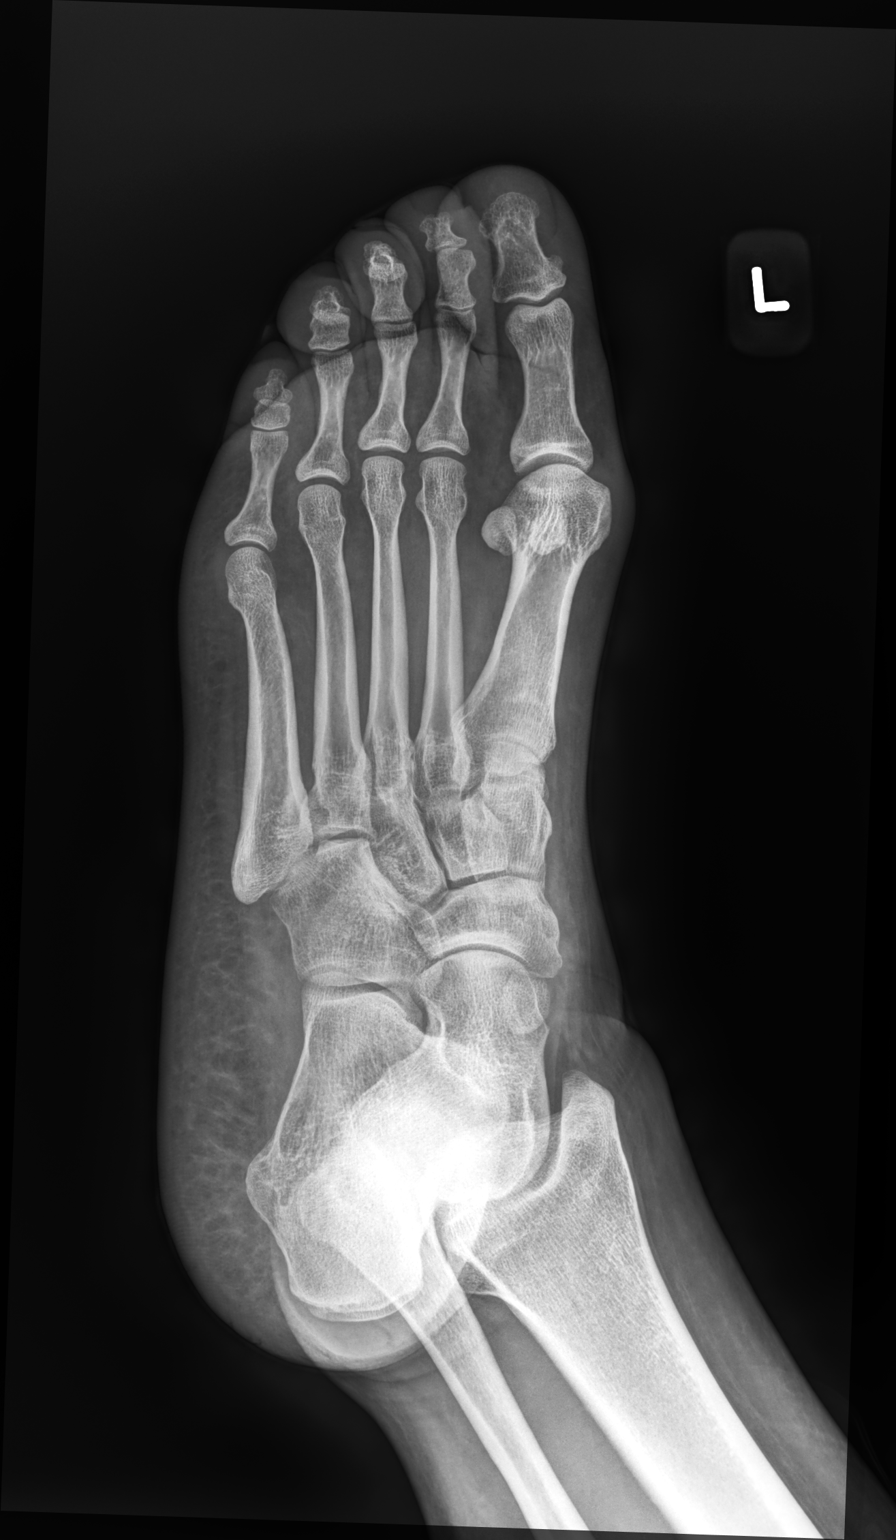

[foot lat]
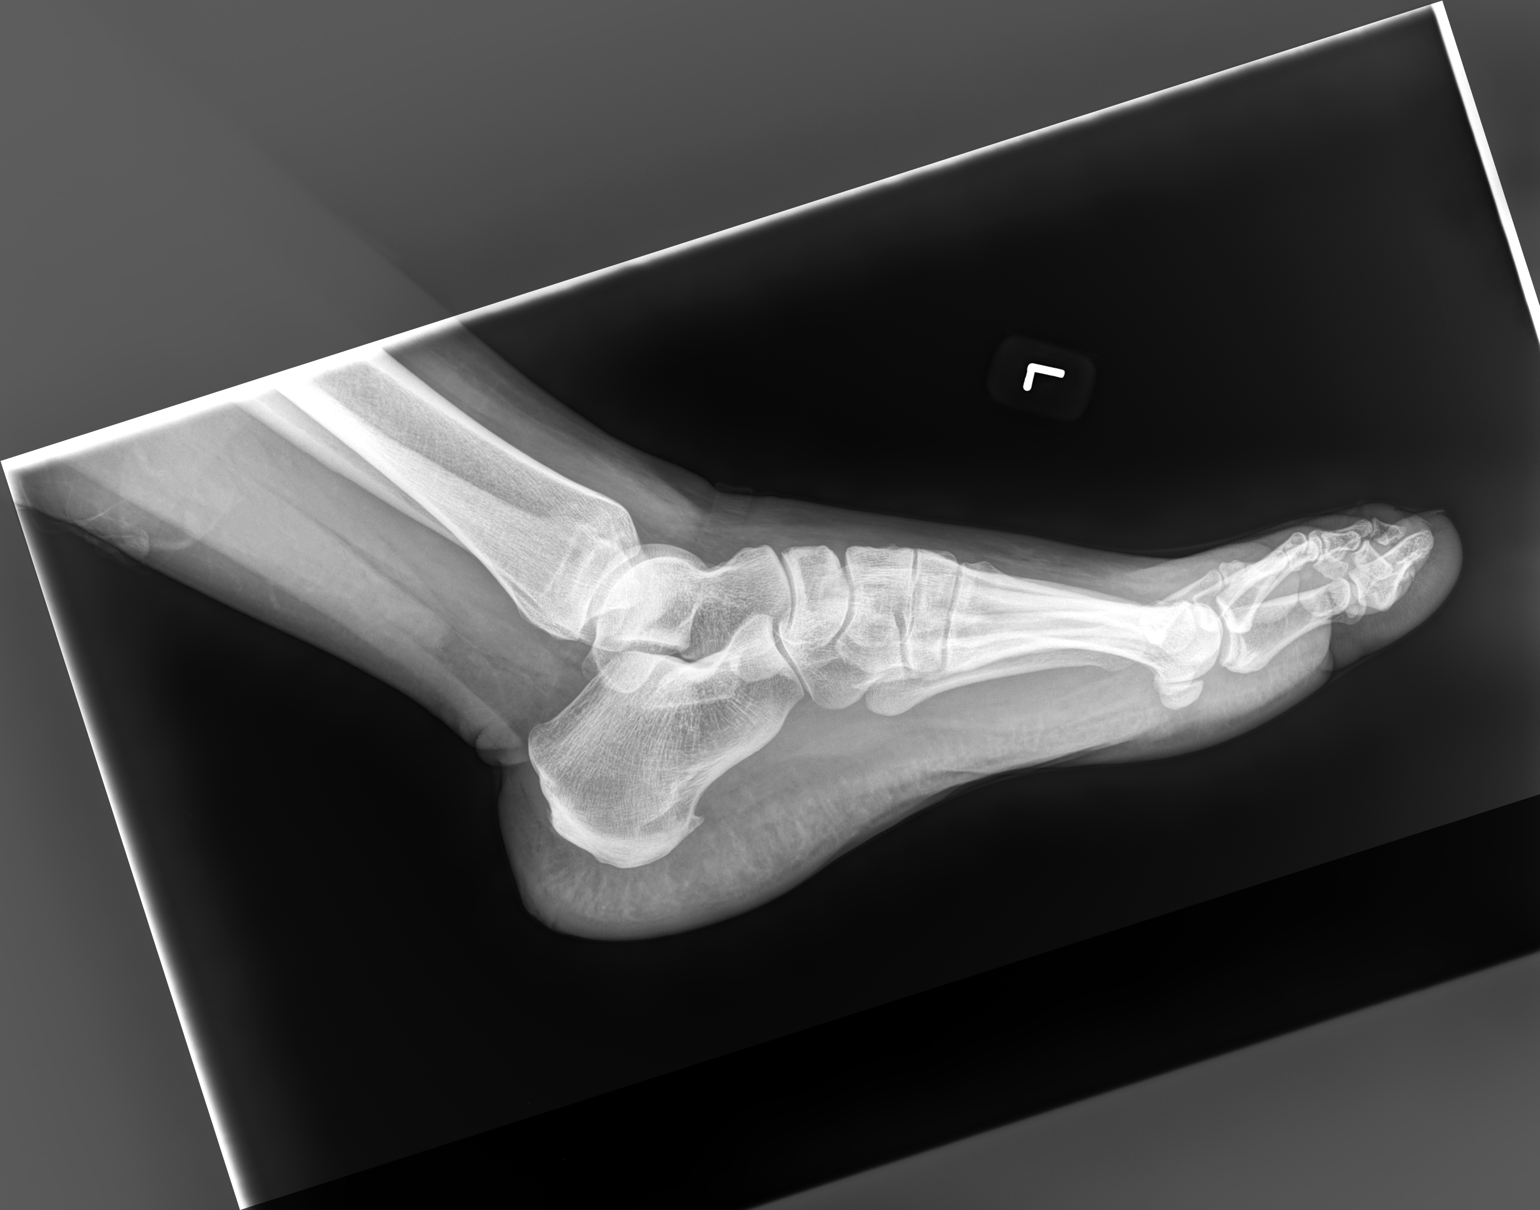

[3 of 3 positions shown; findings below may reference images not displayed]

FINDINGS: There is no evidence of fracture or dislocation. A plantar calcaneal
enthesophyte is noted. Soft tissues are unremarkable.
IMPRESSION: No acute osseous injury.

## 2024-03-25 ENCOUNTER — Other Ambulatory Visit: Payer: Self-pay

## 2024-03-25 ENCOUNTER — Ambulatory Visit: Payer: MEDICAID | Admitting: Physical Therapy

## 2024-03-25 ENCOUNTER — Encounter: Payer: Self-pay | Admitting: Physical Therapy

## 2024-03-25 DIAGNOSIS — M5459 Other low back pain: Secondary | ICD-10-CM | POA: Diagnosis present

## 2024-03-25 DIAGNOSIS — M25552 Pain in left hip: Secondary | ICD-10-CM | POA: Insufficient documentation

## 2024-03-25 DIAGNOSIS — M25551 Pain in right hip: Secondary | ICD-10-CM | POA: Diagnosis present

## 2024-03-25 DIAGNOSIS — M6281 Muscle weakness (generalized): Secondary | ICD-10-CM | POA: Diagnosis present

## 2024-03-25 DIAGNOSIS — R293 Abnormal posture: Secondary | ICD-10-CM | POA: Diagnosis present

## 2024-03-25 NOTE — Therapy (Signed)
 OUTPATIENT PHYSICAL THERAPY THORACOLUMBAR EVALUATION   Patient Name: JAYCELYNN KNICKERBOCKER MRN: [id number] DOB:1980/08/29, 43 y.o., female Today's Date: 03/25/2024  END OF SESSION:  PT End of Session - 03/25/24 0936     Visit Number 1    Number of Visits 6    Date for PT Re-Evaluation 05/06/24    Authorization Type [employer]    Authorization Time Period 6 visits approved    PT Start Time 0930    PT Stop Time 1017    PT Time Calculation (min) 47 min          Past Medical History:  Diagnosis Date   BV (bacterial vaginosis) 01/25/2017   Depression    Genital warts 2008   Heart palpitations    Scoliosis    childhood   Past Surgical History:  Procedure Laterality Date   SPINAL FUSION     TUBAL LIGATION  02/2016   Patient Active Problem List   Diagnosis Date Noted   Reaction to severe stress, unspecified 11/23/2017   Severe episode of recurrent major depressive disorder, without psychotic features (HCC)    Pregnancy 04/17/2015   PTSD (post-traumatic stress disorder) 04/17/2015   Homeless 04/17/2015    PCP: None indicated on chart  REFERRING PROVIDER: Darnella Dorn SAUNDERS, MD  REFERRING DIAG: [phone] (ICD-10-CM) - Other specified deforming dorsopathies, site unspecified  Rationale for Evaluation and Treatment: Rehabilitation  THERAPY DIAG:  Abnormal posture  Other low back pain  Muscle weakness (generalized)  Pain in right hip  Pain in left hip  ONSET DATE: 2 years  SUBJECTIVE:                                                                                                                                                                                           SUBJECTIVE STATEMENT: Pt reports history of scoliosis. Has not had issues with her back until the last 2 years. Had children and weight gain. I feel uneven. Reports more weight on L LE. Reports increased pain and numbness in L LE. Has to keep walking or moving -- can't stand still. Slowly started  worsening. Has tried to lose weight and muscle relaxers but didn't really help. Constant neck pain.   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 Yes: NPRS scale: 0 currently, at worst 8/10 Pain location: L>R posterior hip and low back Pain description: stiffness, aching, heaviness Aggravating factors: prolonged standing Relieving factors: sitting  PRECAUTIONS: Fall  RED FLAGS: None  WEIGHT BEARING RESTRICTIONS: No  FALLS:  Has patient fallen in last 6 months? Yes. Number of falls at least 3; reports frequent falls -- sliding on floors and tripping over her kids toys. 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  DIAGNOSTIC FINDINGS:  10/04/23 x-ray imaging: AP lateral thoracic and lumbar images are obtained and reviewed.  Patient  has thoracolumbar fusion for right thoracic left lumbar fusion.  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  Fusion mass appears  satisfactory.  No rod or hook displacement.   Impression previous fusion with adjacent degenerative changes at the 2  lower L4-5 and L5-S1 mobile levels.  PATIENT SURVEYS:  Modified Oswestry Low Back Pain Disability Questionnaire: 21 / 50 = 42.0 % MODIFIED OSWESTRY DISABILITY SCALE  Date: 03/25/24 Score  Pain intensity 5 =  Pain medication has no effect on my pain.  2. Personal  care (washing, dressing, etc.) 0 =  I can take care of myself normally without causing increased pain.  3. Lifting 4 = I can lift only very light weights  4. Walking 1 = Pain prevents me from walking more than 1 mile.  5. Sitting 2 =  Pain prevents me from sitting more than 1 hour.  6. Standing 1 =  I can stand as long as I want but, it increases my pain.  7. Sleeping 3 =  Even when I take pain medication, I sleep less than 4 hours.  8. Social Life 1 =  My social life is normal, but it increases my level of pain.  9. Traveling 2 =  My pain restricts my travel over 2 hours.  10. Employment/ Homemaking 2 = I can perform most of my homemaking/job duties, but pain prevents me from performing more physically stressful activities (eg, lifting, vacuuming).  Total 21/50   Interpretation of scores: Score Category Description  0-20% Minimal Disability The patient can cope with most living activities. Usually no treatment is indicated apart from advice on lifting, sitting and exercise  21-40% Moderate Disability The patient experiences more pain and difficulty with sitting, lifting and standing. Travel and social life are more difficult and they may be disabled from work. Personal care, sexual activity and sleeping are not grossly affected, and the patient can usually be managed by conservative means  41-60% Severe Disability Pain remains the main problem in this group, but activities of daily living are affected. These patients require a detailed investigation  61-80% Crippled Back pain impinges on all aspects of the patient's life. Positive intervention is required  81-100% Bed-bound  These patients are either bed-bound or exaggerating their symptoms  Bluford FORBES Zoe DELENA Karon DELENA, et al. Surgery versus conservative management of stable thoracolumbar fracture: the PRESTO feasibility RCT. [place] ([place]): [employer]; 2021 Nov. [facility] Technology Assessment, No. 25.62.) Appendix 3, Oswestry  Disability Index category descriptors. Available from: [employer]el[employer]cz  Minimally Clinically Important Difference (MCID) = 12.8%  COGNITION: Overall cognitive status: Within functional limits for tasks assessed     SENSATION: Constant L  anterolateral thigh numbness.   MUSCLE LENGTH: Hamstrings: L tighter than R Thomas test: R tighter than L Quad test prone: 90 deg on R, 100 deg on L   POSTURE: R iliac crest higher than L in standing, L spine slightly rotated L True leg length discrepancy: 86 cm leg length L, 98 cm leg length R  PALPATION: TTP R lumbar paraspinals>L, L proximal glute max > R  LUMBAR ROM:   AROM eval  Flexion 100%  Extension 25%  Right lateral flexion 1 above knee  Left lateral flexion To knee joint  Right rotation 100%  Left rotation 100%   (Blank rows = not tested)  LOWER EXTREMITY ROM:     Active  Right eval Left eval  Hip flexion    Hip extension    Hip abduction    Hip adduction    Hip internal rotation    Hip external rotation    Knee flexion    Knee extension    Ankle dorsiflexion    Ankle plantarflexion    Ankle inversion    Ankle eversion     (Blank rows = not tested)  LOWER EXTREMITY MMT:    MMT Right eval Left eval  Hip flexion 4- 4-  Hip extension 3+ 3+  Hip abduction 3+ 3+  Hip adduction    Hip internal rotation    Hip external rotation    Knee flexion 5 5  Knee extension 5 5  Ankle dorsiflexion    Ankle plantarflexion    Ankle inversion    Ankle eversion     (Blank rows = not tested)  LUMBAR SPECIAL TESTS:  Straight leg raise test: Negative, FABER test: Pain on L, and Thomas test: Pain on R  FUNCTIONAL TESTS:  SLS: Left 1 min, Right 37 sec  GAIT: Distance walked: Into clinic Assistive device utilized: None Level of assistance: Complete Independence Comments: Normal reciprocal pattern  TREATMENT DATE: 03/25/24 See HEP below  PATIENT EDUCATION:  Education details: Exam  findings, POC, initial HEP Person educated: Patient Education method: Explanation, Demonstration, and Handouts Education comprehension: verbalized understanding, returned demonstration, and needs further education  HOME EXERCISE PROGRAM: Access Code: [email or web address] URL: [email or web address] Date: 03/25/2024 Prepared by: Fredi Geiler April Earnie Starring  Exercises - Seated Piriformis Stretch  - 1 x daily - 7 x weekly - 2 sets - 30 sec hold - Seated Hip Flexor Stretch  - 1 x daily - 7 x weekly - 2 sets - 30 sec hold - Lateral Shift Correction at Wall  - 1 x daily - 7 x weekly - 2 sets - 30 sec hold - Standing Lumbar Extension at Wall - Forearms  - 1 x daily - 7 x weekly - 2 sets - 30 sec hold - Standing 'L' Stretch at Counter  - 1 x daily - 7 x weekly - 2 sets - 30 sec hold  ASSESSMENT:  CLINICAL IMPRESSION: Patient is a 43 y.o. F who was seen today for physical therapy evaluation and treatment for low back pain. PMH is significant for scoliosis with surgery to fix it when she was 18. Pain exacerbated ~2 years ago and has slowly worsened. Assessment is significant for postural abnormalities with R iliac crest higher than L in standing, true leg length discrepancy with R LE longer than L LE, weak core/hips and muscle imbalances affecting standing stability.  Pt will benefit from PT to improve on these deficits  OBJECTIVE IMPAIRMENTS: Abnormal gait, decreased activity tolerance, decreased balance, decreased endurance, decreased  mobility, difficulty walking, decreased ROM, decreased strength, increased fascial restrictions, increased muscle spasms, impaired flexibility, improper body mechanics, postural dysfunction, and pain.   ACTIVITY LIMITATIONS: carrying, lifting, bending, standing, squatting, and transfers  PARTICIPATION LIMITATIONS: meal prep, cleaning, shopping, community activity, and occupation  PERSONAL FACTORS: Age, Fitness, Past/current experiences, Profession, and Time since  onset of injury/illness/exacerbation are also affecting patient's functional outcome.   REHAB POTENTIAL: Good  CLINICAL DECISION MAKING: Evolving/moderate complexity  EVALUATION COMPLEXITY: Moderate   GOALS: Goals reviewed with patient? Yes  SHORT TERM GOALS: Target date: 04/22/2024   Pt will be ind with initial HEP Baseline: Goal status: INITIAL  2.  Pt will be able to tolerate SLS on R for at least 45 sec to demo improving weightshift Baseline:  Goal status: INITIAL    LONG TERM GOALS: Target date: 05/20/2024   Pt will be ind with management and progression of HEP Baseline:  Goal status: INITIAL  2.  Pt will report improved pain by >/=75% Baseline:  Goal status: INITIAL  3.  Pt will have improved modified Oswestry to </=36% to demo MCID Baseline: 42% Goal status: INITIAL  4.  Pt will be able to verbalize proper postural and body mechanics with home and work tasks Baseline:  Goal status: INITIAL  5.  Pt will be able to maintain R SLS = L SLS to demo increasing weight shift and stability Baseline:  Goal status: INITIAL    PLAN:  PT FREQUENCY: 1-2x/week  PT DURATION: 8 weeks  PLANNED INTERVENTIONS: 97164- PT Re-evaluation, 97750- Physical Performance Testing, 97110-Therapeutic exercises, 97530- Therapeutic activity, 97112- Neuromuscular re-education, 97535- Self Care, 02859- Manual therapy, [phone]- Gait training, [phone]- Electrical stimulation (unattended), [phone]- Ionotophoresis 4mg /ml Dexamethasone, 20560 (1-2 muscles), 20561 (3+ muscles)- Dry Needling, Patient/Family education, Balance training, Stair training, Taping, Joint mobilization, Spinal mobilization, Cryotherapy, and Moist heat.  PLAN FOR NEXT SESSION: Assess response to HEP. Manual therapy as indicated. Initiate strengthening hips and core -- consider quadruped exercises. Did pt get EvenUp to decrease leg length discrepancy? Heel lifts?    Angie Hogg April Ma L Eliel Dudding, PT, DPT 03/25/2024, 1:01 PM

## 2024-04-04 ENCOUNTER — Telehealth: Payer: Self-pay | Admitting: Physical Therapy

## 2024-04-04 ENCOUNTER — Ambulatory Visit: Payer: MEDICAID | Admitting: Physical Therapy

## 2024-04-04 NOTE — Telephone Encounter (Signed)
 Pt did not show for PT appointment today. They were contacted and informed of this by phone and she states she did not get a reminder text about the appointment that she did gid a text about her appointment on 03/30/24 so she did not know she had a PT appointment today.  She does plan to be at her next appointment.   Redell Moose, PT, DPT 04/04/24 10:06 AM

## 2024-04-09 ENCOUNTER — Encounter: Payer: Self-pay | Admitting: Physical Therapy

## 2024-04-09 ENCOUNTER — Ambulatory Visit: Payer: MEDICAID | Admitting: Physical Therapy

## 2024-04-09 DIAGNOSIS — M6281 Muscle weakness (generalized): Secondary | ICD-10-CM

## 2024-04-09 DIAGNOSIS — M25551 Pain in right hip: Secondary | ICD-10-CM

## 2024-04-09 DIAGNOSIS — R293 Abnormal posture: Secondary | ICD-10-CM | POA: Diagnosis not present

## 2024-04-09 DIAGNOSIS — M25552 Pain in left hip: Secondary | ICD-10-CM

## 2024-04-09 DIAGNOSIS — M5459 Other low back pain: Secondary | ICD-10-CM

## 2024-04-09 NOTE — Therapy (Signed)
 OUTPATIENT PHYSICAL THERAPY TREATMENT   Patient Name: Cathy Dunn MRN: [id number] DOB:12/05/1980, 43 y.o., female Today's Date: 04/09/2024  END OF SESSION:  PT End of Session - 04/09/24 0938     Visit Number 2    Number of Visits 6    Date for Recertification  05/06/24    Authorization Type [employer]    Authorization Time Period 6 visits approved    PT Start Time [phone]    PT Stop Time 1015    PT Time Calculation (min) 38 min    Behavior During Therapy [facility] for tasks assessed/performed          Past Medical History:  Diagnosis Date   BV (bacterial vaginosis) 01/25/2017   Depression    Genital warts 2008   Heart palpitations    Scoliosis    childhood   Past Surgical History:  Procedure Laterality Date   SPINAL FUSION     TUBAL LIGATION  02/2016   Patient Active Problem List   Diagnosis Date Noted   Reaction to severe stress, unspecified 11/23/2017   Severe episode of recurrent major depressive disorder, without psychotic features (HCC)    Pregnancy 04/17/2015   PTSD (post-traumatic stress disorder) 04/17/2015   Homeless 04/17/2015    PCP: None indicated on chart  REFERRING PROVIDER: Darnella Dorn SAUNDERS, MD  REFERRING DIAG: [phone] (ICD-10-CM) - Other specified deforming dorsopathies, site unspecified  Rationale for Evaluation and Treatment: Rehabilitation  THERAPY DIAG:  Abnormal posture  Other low back pain  Muscle weakness (generalized)  Pain in right hip  Pain in left hip  ONSET DATE: 2 years  SUBJECTIVE:                                                                                                                                                                                           SUBJECTIVE STATEMENT: Relays she has ordered heel lift, has not came in yet. She says the exercises are going okay, not much pain at the moment upon arrival.   PERTINENT HISTORY:  43 year old female history of obesity, upper thoracic to L4 Harrington  rod construct for AIS who presents with 1 year history of progressive low back pain  PAIN:  Are you having pain? Yes: NPRS scale: 0 currently, at worst 8/10 Pain location: L>R posterior hip and low back Pain description: stiffness, aching, heaviness Aggravating factors: prolonged standing Relieving factors: sitting  PRECAUTIONS: Fall  RED FLAGS: None   WEIGHT BEARING RESTRICTIONS: No  FALLS:  Has patient fallen in last 6 months? Yes. Number of falls at least 3; reports frequent falls -- sliding on floors and tripping over  her kids toys. 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  DIAGNOSTIC FINDINGS:  10/04/23 x-ray imaging: AP lateral thoracic and lumbar images are obtained and reviewed.  Patient  has thoracolumbar fusion for right thoracic left lumbar fusion.  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  Fusion mass appears  satisfactory.  No rod or hook displacement.   Impression previous fusion with adjacent degenerative changes at the 2  lower L4-5 and L5-S1 mobile levels.  PATIENT SURVEYS:  Modified Oswestry Low Back Pain Disability Questionnaire: 21 / 50 = 42.0 % MODIFIED OSWESTRY DISABILITY SCALE  Date: 03/25/24 Score  Pain intensity 5 =  Pain medication has no effect on my pain.  2. Personal care (washing, dressing, etc.) 0 =  I can take care of myself normally without causing increased pain.  3. Lifting 4 = I can lift only very light weights  4. Walking 1 = Pain prevents me from  walking more than 1 mile.  5. Sitting 2 =  Pain prevents me from sitting more than 1 hour.  6. Standing 1 =  I can stand as long as I want but, it increases my pain.  7. Sleeping 3 =  Even when I take pain medication, I sleep less than 4 hours.  8. Social Life 1 =  My social life is normal, but it increases my level of pain.  9. Traveling 2 =  My pain restricts my travel over 2 hours.  10. Employment/ Homemaking 2 = I can perform most of my homemaking/job duties, but pain prevents me from performing more physically stressful activities (eg, lifting, vacuuming).  Total 21/50   Interpretation of scores: Score Category Description  0-20% Minimal Disability The patient can cope with most living activities. Usually no treatment is indicated apart from advice on lifting, sitting and exercise  21-40% Moderate Disability The patient experiences more pain and difficulty with sitting, lifting and standing. Travel and social life are more difficult and they may be disabled from work. Personal care, sexual activity and sleeping are not grossly affected, and the patient can usually be managed by conservative means  41-60% Severe Disability Pain remains the main problem in this group, but activities of daily living are affected. These patients require a detailed investigation  61-80% Crippled Back pain impinges on all aspects of the patient's life. Positive intervention is required  81-100% Bed-bound  These patients are either bed-bound or exaggerating their symptoms  Bluford FORBES Cathy Dunn Cathy Dunn, et al. Surgery versus conservative management of stable thoracolumbar fracture: the PRESTO feasibility RCT. [place] ([place]): [employer]; 2021 Nov. [facility] Technology Assessment, No. 25.62.) Appendix 3, Oswestry Disability Index category descriptors. Available from: [employer]el[employer].cz  Minimally Clinically Important Difference (MCID) = 12.8%  COGNITION: Overall cognitive  status: Within functional limits for tasks assessed     SENSATION: Constant L anterolateral thigh numbness.   MUSCLE LENGTH: Hamstrings: L tighter than R Thomas test: R tighter than L Quad test prone: 90 deg on R, 100 deg on L  POSTURE: R iliac crest higher than L in standing, L spine slightly rotated L True leg length discrepancy: 86 cm leg length L, 98 cm leg length R  PALPATION: TTP R lumbar paraspinals>L, L proximal glute max > R  LUMBAR ROM:   AROM eval  Flexion 100%  Extension 25%  Right lateral flexion 1 above knee  Left lateral flexion To knee joint  Right rotation 100%  Left rotation 100%   (Blank rows = not tested)  LOWER EXTREMITY ROM:     Active  Right eval Left eval  Hip flexion    Hip extension    Hip abduction    Hip adduction    Hip internal rotation    Hip external rotation    Knee flexion    Knee extension    Ankle dorsiflexion    Ankle plantarflexion    Ankle inversion    Ankle eversion     (Blank rows = not tested)  LOWER EXTREMITY MMT:    MMT Right eval Left eval  Hip flexion 4- 4-  Hip extension 3+ 3+  Hip abduction 3+ 3+  Hip adduction    Hip internal rotation    Hip external rotation    Knee flexion 5 5  Knee extension 5 5  Ankle dorsiflexion    Ankle plantarflexion    Ankle inversion    Ankle eversion     (Blank rows = not tested)  LUMBAR SPECIAL TESTS:  Straight leg raise test: Negative, FABER test: Pain on L, and Thomas test: Pain on R  FUNCTIONAL TESTS:  SLS: Left 1 min, Right 37 sec  GAIT: Distance walked: Into clinic Assistive device utilized: None Level of assistance: Complete Independence Comments: Normal reciprocal pattern  TREATMENT DATE:  04/09/24 Therex: Nustep L8 X 8 min UE/LE seat #8 Seated piriformis stretch 30 sec X 2 bilat Seated hip flexor stretch 30 sec X 2 bilat Standing lateral shift correction to left 30 sec X 2 Standing lumbar extensions 30 sec X 2 Standing L stretch 30 sec X  2  Neuromuscular Re-ed (core and posture) Quadriped alternating hip extensions X 5 bilat, progressed to bird dogs (arms and legs opposite and alternating) X 5 bilat Bridges 5 sec hold X 10    PATIENT EDUCATION:  Education details: Exam findings, POC, initial HEP Person educated: Patient Education method: Explanation, Demonstration, and Handouts Education comprehension: verbalized understanding, returned demonstration, and needs further education  HOME EXERCISE PROGRAM: Access Code: [email or web address] URL: [email or web address] Date: 04/09/2024 Prepared by: Redell Moose  Exercises - Seated Piriformis Stretch  - 1 x daily - 7 x weekly - 2 sets - 30 sec hold - Seated Hip Flexor Stretch  - 1 x daily - 7 x weekly - 2 sets - 30 sec hold - Lateral Shift Correction at Wall  - 1 x daily - 7 x weekly - 2 sets - 30 sec hold - Standing Lumbar Extension at Wall - Forearms  - 1 x daily - 7 x weekly - 2 sets - 30 sec hold - Standing 'L' Stretch at Counter  - 1 x daily - 7 x weekly - 2 sets - 30 sec hold - Supine Bridge  - 2 x daily - 6 x weekly - 1 sets - 10 reps - 5 hold - Bird Dog  - 2 x daily - 6 x weekly - 1-2 sets - 5 reps - 5 sec hold  ASSESSMENT:  CLINICAL IMPRESSION:Session focused on HEP review as well as introducing some  aerobic activity and core stability work. I added 2 exercises into her HEP for this. Progress as tolerated.    Patient is a 43 y.o. F who was seen today for physical therapy evaluation and treatment for low back pain. PMH is significant for scoliosis with surgery to fix it when she was 18. Pain exacerbated ~2 years ago and has slowly worsened. Assessment is significant for postural abnormalities with R iliac crest higher than L in standing, true leg length discrepancy with R LE longer than L LE, weak core/hips and muscle imbalances affecting standing stability.  Pt will benefit from PT to improve on these deficits  OBJECTIVE IMPAIRMENTS: Abnormal gait, decreased  activity tolerance, decreased balance, decreased endurance, decreased mobility, difficulty walking, decreased ROM, decreased strength, increased fascial restrictions, increased muscle spasms, impaired flexibility, improper body mechanics, postural dysfunction, and pain.   ACTIVITY LIMITATIONS: carrying, lifting, bending, standing, squatting, and transfers  PARTICIPATION LIMITATIONS: meal prep, cleaning, shopping, community activity, and occupation  PERSONAL FACTORS: Age, Fitness, Past/current experiences, Profession, and Time since onset of injury/illness/exacerbation are also affecting patient's functional outcome.   REHAB POTENTIAL: Good  CLINICAL DECISION MAKING: Evolving/moderate complexity  EVALUATION COMPLEXITY: Moderate   GOALS: Goals reviewed with patient? Yes  SHORT TERM GOALS: Target date: 04/22/2024   Pt will be ind with initial HEP Baseline: Goal status: INITIAL  2.  Pt will be able to tolerate SLS on R for at least 45 sec to demo improving weightshift Baseline:  Goal status: INITIAL    LONG TERM GOALS: Target date: 05/20/2024   Pt will be ind with management and progression of HEP Baseline:  Goal status: INITIAL  2.  Pt will report improved pain by >/=75% Baseline:  Goal status: INITIAL  3.  Pt will have improved modified Oswestry to </=36% to demo MCID Baseline: 42% Goal status: INITIAL  4.  Pt will be able to verbalize proper postural and body mechanics with home and work tasks Baseline:  Goal status: INITIAL  5.  Pt will be able to maintain R SLS = L SLS to demo increasing weight shift and stability Baseline:  Goal status: INITIAL    PLAN:  PT FREQUENCY: 1-2x/week  PT DURATION: 8 weeks  PLANNED INTERVENTIONS: 97164- PT Re-evaluation, 97750- Physical Performance Testing, 97110-Therapeutic exercises, 97530- Therapeutic activity, 97112- Neuromuscular re-education, 97535- Self Care, 02859- Manual therapy, [id number]- Gait training, [phone]-  Electrical stimulation (unattended), [phone]- Ionotophoresis 4mg /ml Dexamethasone, 79439 (1-2 muscles), 20561 (3+ muscles)- Dry Needling, Patient/Family education, Balance training, Stair training, Taping, Joint mobilization, Spinal mobilization, Cryotherapy, and Moist heat.  PLAN FOR NEXT SESSION: Aerobic, strength, core, posture   Redell JONELLE Moose, PT, DPT 04/09/2024, 10:12 AM

## 2024-04-16 ENCOUNTER — Ambulatory Visit: Payer: MEDICAID | Admitting: Physical Therapy

## 2024-04-23 ENCOUNTER — Ambulatory Visit: Payer: MEDICAID | Admitting: Physical Therapy

## 2024-05-02 ENCOUNTER — Ambulatory Visit: Payer: MEDICAID | Admitting: Physical Therapy

## 2024-05-13 ENCOUNTER — Encounter: Payer: Self-pay | Admitting: Radiology

## 2024-05-23 DIAGNOSIS — Z0289 Encounter for other administrative examinations: Secondary | ICD-10-CM

## 2024-06-16 ENCOUNTER — Encounter (INDEPENDENT_AMBULATORY_CARE_PROVIDER_SITE_OTHER): Payer: Self-pay | Admitting: Nurse Practitioner

## 2024-06-17 ENCOUNTER — Ambulatory Visit (INDEPENDENT_AMBULATORY_CARE_PROVIDER_SITE_OTHER): Payer: MEDICAID | Admitting: Nurse Practitioner

## 2024-06-17 ENCOUNTER — Encounter (INDEPENDENT_AMBULATORY_CARE_PROVIDER_SITE_OTHER): Payer: Self-pay | Admitting: Nurse Practitioner

## 2024-06-17 VITALS — BP 139/84 | HR 68 | Temp 98.2°F | Ht 64.5 in | Wt 243.0 lb

## 2024-06-17 DIAGNOSIS — M549 Dorsalgia, unspecified: Secondary | ICD-10-CM

## 2024-06-17 DIAGNOSIS — G8929 Other chronic pain: Secondary | ICD-10-CM

## 2024-06-17 DIAGNOSIS — D649 Anemia, unspecified: Secondary | ICD-10-CM

## 2024-06-17 DIAGNOSIS — E66812 Obesity, class 2: Secondary | ICD-10-CM

## 2024-06-17 DIAGNOSIS — Z6839 Body mass index (BMI) 39.0-39.9, adult: Secondary | ICD-10-CM

## 2024-06-17 DIAGNOSIS — E559 Vitamin D deficiency, unspecified: Secondary | ICD-10-CM

## 2024-06-17 DIAGNOSIS — E6609 Other obesity due to excess calories: Secondary | ICD-10-CM

## 2024-06-17 DIAGNOSIS — R0602 Shortness of breath: Secondary | ICD-10-CM

## 2024-06-17 DIAGNOSIS — Z1331 Encounter for screening for depression: Secondary | ICD-10-CM

## 2024-06-17 DIAGNOSIS — R5383 Other fatigue: Secondary | ICD-10-CM

## 2024-06-17 NOTE — Progress Notes (Signed)
 [address] [place],  [place], [place] [address]  Office: [phone]  /  Fax: [phone]   Subjective   Initial Visit  Cathy Dunn (MR# [id number]) is a 43 y.o. female who presents for evaluation and treatment of obesity and related comorbidities. Current BMI is Body mass index is 41.07 kg/m. Cathy Dunn has been struggling with her weight for many years and has been unsuccessful in either losing weight, maintaining weight loss, or reaching her healthy weight goal.  Cathy Dunn is currently in the action stage of change and ready to dedicate time achieving and maintaining a healthier weight. Cathy Dunn is interested in becoming our patient and working on intensive lifestyle modifications including (but not limited to) diet and exercise for weight loss.   Cathy Dunn had a complete spinal fusion at the age of 6 due to severe scoliosis. Her left leg is longer than the other which contributes to left hip pain because of how she stands all day- does not use a lift in her shoe. Ortho has discussed an additional back surgery but want her to lose weight first. She wants to lose weight to be able to keep up with her 48 year old son who has severe autism. She previously lost 50 pounds with Keto and was doing much more physical activity and felt so much better. She kept it off for about 4 months and then regained the weight.  Weight history:  When asked how their weight has affected their life and health, she states: Has affected self-esteem, Contributed to medical problems, Contributed to orthopedic problems or mobility issues, Having fatigue, and Having poor endurance  When asked what else they would like to accomplish? She states: Adopt a healthier eating pattern and lifestyle, Improve energy levels and physical activity, Improve existing medical conditions, Improve quality of life, Improve appearance, and Improve self-confidence  She starting to note weight gain during : teens at age 42 when she went into  puberty. Got down to 145 when she had scoliosis surgery adulthood with pregnancies 20, 12, 11, 10. Youngest 2 have autism( wants to lose weight to keep up with him)  Life events associated with weight gain include : pregnancy and job change. 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   Other contributing factors: family history of obesity and moderate to high levels of stress.  Their highest weight has been:   247lbs.  Desired weight: 200  Previous weight-loss programs : Ketogenic.  Their maximum weight loss was:  50 lbs.kept it off for 4 months  Their greatest challenge with dieting: low energy and difficulty maintaining reduced calorie state.  Current or previous pharmacotherapy: None and Is interested in pharmacotherapy.  Response to medication: Never tried medications   Nutritional History:  Current nutrition plan: Low-carb.  How many times do you eat outside the home: 5-7 per week  How often do they skip meals: does not skip meals  What beverages do they drink: water.   Use of artificial sweetners : Yes  Food intolerances or dislikes: canned food.  Food triggers: Stress, Boredom, When feeling guilty, To help comfort self, and When Sad.  Food cravings: Fast Food  Do they struggle with excessive hunger or portion control : Yes    Physical Activity:  Current level of physical activity: None  Barriers to Exercise: energy, orthopedic problems, and chronic pain   Past medical history includes:   Past Medical History:  Diagnosis Date   Anxiety    Back pain  BV (bacterial vaginosis) 01/25/2017   Depression    Genital warts 2008   Heart palpitations    Joint pain    Palpitation    Scoliosis    childhood   SOB (shortness of breath) on exertion      Objective   BP 139/84   Pulse 68   Temp 98.2 F (36.8 C)   Ht 5' 4.5 (1.638 m)   Wt 243 lb (110.2 kg)   SpO2 100%   BMI 41.07 kg/m  She was weighed on the bioimpedance scale: Body mass  index is 41.07 kg/m.    Anthropometrics:  Vitals Temp: 98.2 F (36.8 C) BP: 139/84 Pulse Rate: 68 SpO2: 100 %   Anthropometric Measurements Height: 5' 4.5 (1.638 m) Weight: 243 lb (110.2 kg) BMI (Calculated): 41.08 Weight at Last Visit: N/a Weight Lost Since Last Visit: N/a Weight Gained Since Last Visit: N/a Starting Weight: 243lb Total Weight Loss (lbs): 0 lb (0 kg) Peak Weight: 260lb Waist Measurement : 47 inches   Body Composition  Body Fat %: 46.3 % Fat Mass (lbs): 112.6 lbs Muscle Mass (lbs): 124 lbs Total Body Water (lbs): 86.8 lbs Visceral Fat Rating : 13   Other Clinical Data RMR: 1843 Fasting: Yes Labs: Yes Today's Visit #: 1 Starting Date: 06/17/24    Physical Exam:  General: She is overweight, cooperative, alert, well developed, and in no acute distress. PSYCH: Has normal mood, affect and thought process.   HEENT: EOMI, sclerae are anicteric. Lungs: Normal breathing effort, no conversational dyspnea. Extremities: No edema.  Neurologic: No gross sensory or motor deficits. No tremors or fasciculations noted.    Diagnostic Data Reviewed  EKG: Sinus bradycardia, rate 58. T wave abnormality- no change compared to 2011 EKG.  Indirect Calorimeter completed today shows a VO2 of 268 and a REE of 1843.  Her calculated basal metabolic rate is 8148 thus her resting energy expenditure same as calculated.  Depression Screen  Dorea's PHQ-9 score was: 16. She tried Citalopram in the past, felt more indifference.She feels better since not being on medication.      06/17/2024    8:52 AM  Depression screen PHQ 2/9  Decreased Interest 2  Down, Depressed, Hopeless 2  PHQ - 2 Score 4  Altered sleeping 2  Tired, decreased energy 3  Change in appetite 3  Feeling bad or failure about yourself  3  Trouble concentrating 1  Moving slowly or fidgety/restless 0  Suicidal thoughts 0  PHQ-9 Score 16  Difficult doing work/chores Not difficult at all     Screening for Sleep Related Breathing Disorders  Chesney denies daytime somnolence and admits to waking up still tired. Patient has a history of symptoms of daytime fatigue and morning fatigue. Cathy Dunn generally gets 5 or 6 hours of sleep per night, and states that she has nightime awakenings. Snoring is not present. Apneic episodes are present. Epworth Sleepiness Score is 2.   BMET    Component Value Date/Time   NA 138 06/28/2018 1258   K 3.5 06/28/2018 1258   CL 108 06/28/2018 1258   CO2 24 06/28/2018 1258   GLUCOSE 96 06/28/2018 1258   BUN 13 06/28/2018 1258   CREATININE 0.67 06/28/2018 1258   CALCIUM 9.0 06/28/2018 1258   GFRNONAA >60 06/28/2018 1258   GFRAA >60 06/28/2018 1258   No results found for: HGBA1C No results found for: INSULIN  CBC    Component Value Date/Time   WBC 4.6 06/28/2018 1258   RBC 4.10 06/28/2018  1258   HGB 9.8 (L) 06/28/2018 1258   HCT 32.9 (L) 06/28/2018 1258   PLT 429 (H) 06/28/2018 1258   MCV 80.2 06/28/2018 1258   MCH 23.9 (L) 06/28/2018 1258   MCHC 29.8 (L) 06/28/2018 1258   RDW 15.8 (H) 06/28/2018 1258   Iron/TIBC/Ferritin/ %Sat No results found for: IRON, TIBC, FERRITIN, IRONPCTSAT Lipid Panel  No results found for: CHOL, TRIG, HDL, CHOLHDL, VLDL, LDLCALC, LDLDIRECT Hepatic Function Panel  No results found for: PROT, ALBUMIN, AST, ALT, ALKPHOS, BILITOT, BILIDIR, IBILI No results found for: TSH   Assessment and Plan   Class 2 obesity due to excess calories without serious comorbidity with body mass index (BMI) of 39.0 to 39.9 in adult  TREATMENT PLAN FOR OBESITY:  Recommended Dietary Goals  Nohemy is currently in the action stage of change. As such, her goal is to implement medically supervised obesity management plan.  She has agreed to implement: the Category 2 plan - 1200 kcal per day  Behavioral Intervention  We discussed the following Behavioral Modification Strategies  today: increasing lean protein intake to established goals, decreasing simple carbohydrates , increasing vegetables, increasing lower glycemic fruits, increasing fiber rich foods, avoiding skipping meals, increasing water intake, work on meal planning and preparation, reading food labels , keeping healthy foods at home, identifying sources and decreasing liquid calories, decreasing eating out or consumption of processed foods, and making healthy choices when eating convenient foods, planning for success, and better snacking choices  Additional resources provided today: Handout on healthy eating and balanced plate, Handout on complex carbohydrates and lean sources of protein, Category 2 packet, and Handout principles of weight management  Recommended Physical Activity Goals  Leiloni has been advised to work up to 150 minutes of moderate intensity aerobic activity a week and strengthening exercises 2-3 times per week for cardiovascular health, weight loss maintenance and preservation of muscle mass.   She has agreed to :  Think about enjoyable ways to increase daily physical activity and overcoming barriers to exercise and Increase physical activity in their day and reduce sedentary time (increase NEAT).  Medical Interventions and Pharmacotherapy We will work on building a therapist, art and behavioral strategies. We will discuss the role of pharmacotherapy as an adjunct at subsequent visits.   ASSOCIATED CONDITIONS ADDRESSED TODAY  Other Fatigue  Itali does feel that her weight is causing her energy to be lower than it should be. Fatigue may be related to obesity, depression or many other causes. Labs will be ordered, and in the meanwhile, Lilyan will focus on self care including making healthy food choices, increasing physical activity and focusing on stress reduction. - EKG 12 lead  Shortness of Breath on exertion Donika notes increasing shortness of breath with  physical activity and seems to be worsening over time with weight gain. She notes getting out of breath sooner with activity than she used to. This has not gotten worse recently. Tacy denies shortness of breath at rest or orthopnea.  Anemia, unspecified type History of anemia, currently on no supplementation Draw CBC today and will discuss supplementation pending reults -     CBC with Differential/Platelet  Chronic bilateral back pain, unspecified back location       Continue to follow with ortho       Weight loss of 10-15% can improve back /hip pain  Vitamin D  deficiency -     VITAMIN D  25 Hydroxy (Vit-D Deficiency, Fractures)  Depression screening  Scored 16 on PHQ 9 but states she feels better off of Citalopram which she tried in the past.        Declines further intervention at this time.    Class 2 obesity due to excess calories without serious comorbidity with body mass index (BMI) of 39.0 to 39.9 in adult See plan above -     CBC with Differential/Platelet -     Comprehensive metabolic panel with GFR -     Hemoglobin A1c -     Insulin , random -     Lipid panel -     Magnesium  -     TSH -     Vitamin B12 -     VITAMIN D  25 Hydroxy (Vit-D Deficiency, Fractures)    Follow-up  She was informed of the importance of frequent follow-up visits to maximize her success with intensive lifestyle modifications for her multiple health conditions. She was informed we would discuss her lab results at her next visit unless there is a critical issue that needs to be addressed sooner. Timberlyn agreed to keep her next visit at the agreed upon time to discuss these results.  Attestation Statement  This is the patient's intake visit at [facility]. The patient's Health Questionnaire was reviewed at length. Included in the packet: current and past health history, medications, allergies, ROS, gynecologic history (women only), surgical history, family history, social  history, weight history, weight loss surgery history (for those that have had weight loss surgery), nutritional evaluation, mood and food questionnaire, PHQ9, Epworth questionnaire, sleep habits questionnaire, patient life and health improvement goals questionnaire. These will all be scanned into the patient's chart under media.   During the visit, I independently reviewed the patient's EKG, previous labs, bioimpedance scale results, and indirect calorimetry results. I used this information to medically tailor a meal plan for the patient that will help her to lose weight and will improve her obesity-related conditions. I performed a medically necessary appropriate examination and/or evaluation. I discussed the assessment and treatment plan with the patient. The patient was provided an opportunity to ask questions and all were answered. The patient agreed with the plan and demonstrated an understanding of the instructions. Labs were ordered at this visit and will be reviewed at the next visit unless critical results need to be addressed immediately. Clinical information was updated and documented in the EMR.   In addition, they received basic education on identification of processed foods and reduction of these, different sources of lean proteins and complex carbohydrates and how to eat balanced by incorporation of whole foods.  Reviewed by clinician on day of visit: allergies, medications, problem list, medical history, surgical history, family history, social history, and previous encounter notes.  I personally spent a total of 54 minutes in the care of the patient today including preparing to see the patient, getting/reviewing separately obtained history, performing a medically appropriate exam/evaluation, counseling and educating, placing orders, and documenting clinical information in the EHR. Excludes time for EKG, depression screening and indirect calorimetry   Lonell Liverpool ANP-C

## 2024-06-18 ENCOUNTER — Ambulatory Visit (INDEPENDENT_AMBULATORY_CARE_PROVIDER_SITE_OTHER): Payer: Self-pay | Admitting: Nurse Practitioner

## 2024-06-18 LAB — COMPREHENSIVE METABOLIC PANEL WITH GFR
ALT: 10 IU/L (ref 0–32)
AST: 12 IU/L (ref 0–40)
Albumin: 4.5 g/dL (ref 3.9–4.9)
Alkaline Phosphatase: 43 IU/L (ref 41–116)
BUN/Creatinine Ratio: 13 (ref 9–23)
BUN: 9 mg/dL (ref 6–24)
Bilirubin Total: 0.4 mg/dL (ref 0.0–1.2)
CO2: 23 mmol/L (ref 20–29)
Calcium: 9.1 mg/dL (ref 8.7–10.2)
Chloride: 103 mmol/L (ref 96–106)
Creatinine, Ser: 0.7 mg/dL (ref 0.57–1.00)
Globulin, Total: 2.3 g/dL (ref 1.5–4.5)
Glucose: 89 mg/dL (ref 70–99)
Potassium: 4.5 mmol/L (ref 3.5–5.2)
Sodium: 138 mmol/L (ref 134–144)
Total Protein: 6.8 g/dL (ref 6.0–8.5)
eGFR: 111 mL/min/1.73 (ref >59)

## 2024-06-18 LAB — VITAMIN D 25 HYDROXY (VIT D DEFICIENCY, FRACTURES): Vit D, 25-Hydroxy: 26.7 ng/mL — ABNORMAL LOW (ref 30.0–100.0)

## 2024-06-18 LAB — HEMOGLOBIN A1C
Est. average glucose Bld gHb Est-mCnc: 117 mg/dL
Hgb A1c MFr Bld: 5.7 % — ABNORMAL HIGH (ref 4.8–5.6)

## 2024-06-18 LAB — CBC WITH DIFFERENTIAL/PLATELET
Basophils Absolute: 0 x10E3/uL (ref 0.0–0.2)
Basos: 1 %
EOS (ABSOLUTE): 0.1 x10E3/uL (ref 0.0–0.4)
Eos: 2 %
Hematocrit: 37.3 % (ref 34.0–46.6)
Hemoglobin: 11.7 g/dL (ref 11.1–15.9)
Immature Grans (Abs): 0 x10E3/uL (ref 0.0–0.1)
Immature Granulocytes: 0 %
Lymphocytes Absolute: 1.4 x10E3/uL (ref 0.7–3.1)
Lymphs: 37 %
MCH: 26.9 pg (ref 26.6–33.0)
MCHC: 31.4 g/dL — ABNORMAL LOW (ref 31.5–35.7)
MCV: 86 fL (ref 79–97)
Monocytes Absolute: 0.3 x10E3/uL (ref 0.1–0.9)
Monocytes: 7 %
Neutrophils Absolute: 2.1 x10E3/uL (ref 1.4–7.0)
Neutrophils: 53 %
Platelets: 404 x10E3/uL (ref 150–450)
RBC: 4.35 x10E6/uL (ref 3.77–5.28)
RDW: 14.4 % (ref 11.7–15.4)
WBC: 3.9 x10E3/uL (ref 3.4–10.8)

## 2024-06-18 LAB — LIPID PANEL
Chol/HDL Ratio: 2.3 ratio (ref 0.0–4.4)
Cholesterol, Total: 199 mg/dL (ref 100–199)
HDL: 86 mg/dL (ref >39)
LDL Chol Calc (NIH): 104 mg/dL — ABNORMAL HIGH (ref 0–99)
Triglycerides: 47 mg/dL (ref 0–149)
VLDL Cholesterol Cal: 9 mg/dL (ref 5–40)

## 2024-06-18 LAB — MAGNESIUM: Magnesium: 2.1 mg/dL (ref 1.6–2.3)

## 2024-06-18 LAB — VITAMIN B12: Vitamin B-12: 1068 pg/mL (ref 232–1245)

## 2024-06-18 LAB — INSULIN, RANDOM: INSULIN: 11.3 u[IU]/mL (ref 2.6–24.9)

## 2024-06-18 LAB — TSH: TSH: 1.81 u[IU]/mL (ref 0.450–4.500)

## 2024-07-01 ENCOUNTER — Ambulatory Visit (INDEPENDENT_AMBULATORY_CARE_PROVIDER_SITE_OTHER): Admitting: Nurse Practitioner

## 2024-07-30 ENCOUNTER — Ambulatory Visit (INDEPENDENT_AMBULATORY_CARE_PROVIDER_SITE_OTHER): Admitting: Nurse Practitioner

## 2024-07-30 ENCOUNTER — Encounter (INDEPENDENT_AMBULATORY_CARE_PROVIDER_SITE_OTHER): Payer: Self-pay
